# Patient Record
Sex: Male | Born: 1975 | Race: White | Hispanic: No | Marital: Married | Smoking: Never smoker
Health system: Southern US, Community
[De-identification: ages and names within clinical notes are randomized; demographics above are authoritative.]

## PROBLEM LIST (undated history)

## (undated) ENCOUNTER — Emergency Department (HOSPITAL_COMMUNITY)
Admission: EM | Disposition: A | Discharge status: Home / Self Care | Payer: 59 | Source: Home / Self Care | Attending: Emergency Medicine | Admitting: Emergency Medicine

## (undated) DIAGNOSIS — K219 Gastro-esophageal reflux disease without esophagitis: Secondary | ICD-10-CM

## (undated) DIAGNOSIS — I10 Essential (primary) hypertension: Secondary | ICD-10-CM

## (undated) HISTORY — PX: HERNIA REPAIR: SHX51

---

## 2017-06-14 NOTE — Progress Notes (Addendum)
[facility] [address], [address] [place], [place] [address] [phone] [phone]  Date:  06/15/2017   Name:  Brett Wheeler   DOB:  07/29/1976   MRN:  [id number]  PCP:  Pearline Cablesopland, Noele Icenhour C, MD    Chief Complaint: Establish Care (Pt here to est care. And will need forms filled out for both himself and his wife for foster care paperwork. Flu vaccine declined. )   History of Present Illness:  Brett Mottonson W Haugan is a 41 y.o. very pleasant male patient who presents with the following:  Here today as a new patient- his old doctor moved away, reviewed notes in Epic for him today He is Designer, industrial/productwarehouse manager, has been married for 6 years. They have 5 children at home- he needs his annual foster care form  He is married to Dumashastity They have 2 bio kids and 3 foster.  They have 2 43 yo, 1 12, and then 3 and 2.  History of essential hypertension-  He has been on medication in the past - losartan and HCTZ, but is no longer taking these   He does not really check his BP at home. He does have a cuff He thinks there is a family history of HTN He had a hernia repair as a pre-schooler and some thumb surgery as well  Flu: declines Labs: will do today He had a tetanus vacine 7 years ago   Patient Active Problem List   Diagnosis Date Noted  . Benign essential hypertension 06/15/2017    History reviewed. No pertinent past medical history.  Past Surgical History:  Procedure Laterality Date  . HERNIA REPAIR      Social History  Substance Use Topics  . Smoking status: Not on file  . Smokeless tobacco: Not on file  . Alcohol use Not on file    Family History  Problem Relation Age of Onset  . Cancer Maternal Grandfather   . Lymphoma Paternal Grandfather     Allergies not on file  Medication list has been reviewed and updated.  No current outpatient prescriptions on file prior to visit.   No current facility-administered medications on file prior  to visit.     Review of Systems:  As per HPI- otherwise negative.   Physical Examination: Vitals:   06/15/17 1715  BP: (!) 150/100  Pulse: 99  Temp: 98.1 F (36.7 C)  SpO2: 98%   Vitals:   06/15/17 1715  Weight: 207 lb 12.8 oz (94.3 kg)  Height: 6\' 5"  (1.956 m)   Body mass index is 24.64 kg/m. Ideal Body Weight: Weight in (lb) to have BMI = 25: 210.4  GEN: WDWN, NAD, Non-toxic, A & O x 3, tall build, looks well HEENT: Atraumatic, Normocephalic. Neck supple. No masses, No LAD.  Bilateral TM wnl, oropharynx normal.  PEERL,EOMI.   Ears and Nose: No external deformity. CV: RRR, No M/G/R. No JVD. No thrill. No extra heart sounds. PULM: CTA B, no wheezes, crackles, rhonchi. No retractions. No resp. distress. No accessory muscle use. ABD: S, NT, ND EXTR: No c/c/e NEURO Normal gait.  PSYCH: Normally interactive. Conversant. Not depressed or anxious appearing.  Calm demeanor.    Assessment and Plan: Benign essential hypertension - Plan: losartan (COZAAR) 50 MG tablet  Screening for hyperlipidemia - Plan: Lipid panel  Screening for deficiency anemia - Plan: CBC  Screening for diabetes mellitus - Plan: Hemoglobin A1c, Comprehensive metabolic panel  Completed foster care  form for him today  He does not have any risk factors for TB screening labs as above Will start back on losartan for HTN- he will monitor his BP at home and let me know how it looks in 2-3 weeks   Signed Abbe Amsterdam, MD  Received his labs, letter to pt 11/2 Results for orders placed or performed in visit on 06/15/17  CBC  Result Value Ref Range   WBC 7.4 4.0 - 10.5 K/uL   RBC 4.79 4.22 - 5.81 Mil/uL   Platelets 298.0 150.0 - 400.0 K/uL   Hemoglobin 14.5 13.0 - 17.0 g/dL   HCT 40.9 81.1 - 91.4 %   MCV 89.9 78.0 - 100.0 fl   MCHC 33.7 30.0 - 36.0 g/dL   RDW 78.2 95.6 - 21.3 %  Lipid panel  Result Value Ref Range   Cholesterol 168 0 - 200 mg/dL   Triglycerides 086.5 (H) 0.0 - 149.0 mg/dL    HDL 78.46 (L) >96.29 mg/dL   VLDL 52.8 (H) 0.0 - 41.3 mg/dL   Total CHOL/HDL Ratio 5    NonHDL 136.80   Hemoglobin A1c  Result Value Ref Range   Hgb A1c MFr Bld 5.3 4.6 - 6.5 %  Comprehensive metabolic panel  Result Value Ref Range   Sodium 140 135 - 145 mEq/L   Potassium 3.6 3.5 - 5.1 mEq/L   Chloride 104 96 - 112 mEq/L   CO2 30 19 - 32 mEq/L   Glucose, Bld 106 (H) 70 - 99 mg/dL   BUN 12 6 - 23 mg/dL   Creatinine, Ser 2.44 0.40 - 1.50 mg/dL   Total Bilirubin 0.5 0.2 - 1.2 mg/dL   Alkaline Phosphatase 43 39 - 117 U/L   AST 16 0 - 37 U/L   ALT 20 0 - 53 U/L   Total Protein 6.7 6.0 - 8.3 g/dL   Albumin 4.4 3.5 - 5.2 g/dL   Calcium 9.5 8.4 - 01.0 mg/dL   GFR 27.25 >36.64 mL/min  LDL cholesterol, direct  Result Value Ref Range   Direct LDL 125.0 mg/dL

## 2017-06-15 ENCOUNTER — Encounter: Payer: Self-pay | Admitting: Family Medicine

## 2017-06-15 ENCOUNTER — Ambulatory Visit (INDEPENDENT_AMBULATORY_CARE_PROVIDER_SITE_OTHER): Payer: 59 | Admitting: Family Medicine

## 2017-06-15 VITALS — BP 140/94 | HR 99 | Temp 98.1°F | Ht 77.0 in | Wt 207.8 lb

## 2017-06-15 DIAGNOSIS — Z130 Encounter for screening for diseases of the blood and blood-forming organs and certain disorders involving the immune mechanism: Secondary | ICD-10-CM | POA: Diagnosis not present

## 2017-06-15 DIAGNOSIS — Z131 Encounter for screening for diabetes mellitus: Secondary | ICD-10-CM

## 2017-06-15 DIAGNOSIS — Z13220 Encounter for screening for lipoid disorders: Secondary | ICD-10-CM

## 2017-06-15 DIAGNOSIS — I10 Essential (primary) hypertension: Secondary | ICD-10-CM | POA: Diagnosis not present

## 2017-06-15 MED ORDER — LOSARTAN POTASSIUM 50 MG PO TABS: 50.0000 mg | ORAL_TABLET | Freq: Every day | ORAL | 3 refills | Status: DC

## 2017-06-15 MED FILL — LOSARTAN POTASSIUM 50 MG TA: 50 | 90 days supply | Qty: 90 | Fill #0

## 2017-06-15 NOTE — Patient Instructions (Signed)
It was very nice to see you today!  I will be in touch with your labs asap Let's have you start back on losartan 50 mg daily for your blood pressure. Take this once a day- anytime of day is ok.  Please monitor your BP at home and let me know how it looks in 2-3 weeks

## 2017-06-16 LAB — CBC
HCT: 43 % (ref 39.0–52.0)
Hemoglobin: 14.5 g/dL (ref 13.0–17.0)
MCHC: 33.7 g/dL (ref 30.0–36.0)
MCV: 89.9 fl (ref 78.0–100.0)
PLATELETS: 298 10*3/uL (ref 150.0–400.0)
RBC: 4.79 Mil/uL (ref 4.22–5.81)
RDW: 13.1 % (ref 11.5–15.5)
WBC: 7.4 10*3/uL (ref 4.0–10.5)

## 2017-06-16 LAB — COMPREHENSIVE METABOLIC PANEL
ALT: 20 U/L (ref 0–53)
AST: 16 U/L (ref 0–37)
Albumin: 4.4 g/dL (ref 3.5–5.2)
Alkaline Phosphatase: 43 U/L (ref 39–117)
BUN: 12 mg/dL (ref 6–23)
CALCIUM: 9.5 mg/dL (ref 8.4–10.5)
CHLORIDE: 104 meq/L (ref 96–112)
CO2: 30 meq/L (ref 19–32)
Creatinine, Ser: 1.09 mg/dL (ref 0.40–1.50)
GFR: 78.51 mL/min (ref >60.00)
Glucose, Bld: 106 mg/dL — ABNORMAL HIGH (ref 70–99)
POTASSIUM: 3.6 meq/L (ref 3.5–5.1)
Sodium: 140 mEq/L (ref 135–145)
Total Bilirubin: 0.5 mg/dL (ref 0.2–1.2)
Total Protein: 6.7 g/dL (ref 6.0–8.3)

## 2017-06-16 LAB — LIPID PANEL
CHOLESTEROL: 168 mg/dL (ref 0–200)
HDL: 31.5 mg/dL — AB (ref >39.00)
NONHDL: 136.8
TRIGLYCERIDES: 201 mg/dL — AB (ref 0.0–149.0)
Total CHOL/HDL Ratio: 5
VLDL: 40.2 mg/dL — ABNORMAL HIGH (ref 0.0–40.0)

## 2017-06-16 LAB — HEMOGLOBIN A1C: Hgb A1c MFr Bld: 5.3 % (ref 4.6–6.5)

## 2017-06-16 LAB — LDL CHOLESTEROL, DIRECT: Direct LDL: 125 mg/dL

## 2017-08-16 ENCOUNTER — Ambulatory Visit: Payer: 59 | Admitting: Family Medicine

## 2017-08-16 ENCOUNTER — Encounter: Payer: Self-pay | Admitting: Family Medicine

## 2017-08-16 VITALS — BP 128/84 | HR 90 | Temp 98.4°F | Ht 77.0 in | Wt 210.2 lb

## 2017-08-16 DIAGNOSIS — J40 Bronchitis, not specified as acute or chronic: Secondary | ICD-10-CM | POA: Diagnosis not present

## 2017-08-16 MED ORDER — PREDNISONE 20 MG PO TABS: 40.0000 mg | ORAL_TABLET | Freq: Every day | ORAL | 0 refills | Status: AC

## 2017-08-16 MED ORDER — BENZONATATE 100 MG PO CAPS: 100.0000 mg | ORAL_CAPSULE | Freq: Three times a day (TID) | ORAL | 0 refills | Status: DC | PRN

## 2017-08-16 MED FILL — BENZONATATE 100 MG CAPSULE: 100 | 10 days supply | Qty: 30 | Fill #0

## 2017-08-16 MED FILL — predniSONE 20 MG TABS: 20 | 5 days supply | Qty: 10 | Fill #0

## 2017-08-16 NOTE — Progress Notes (Signed)
Chief Complaint  Patient presents with  . Cough    congestion    Brett Wheeler here for URI complaints.  Duration: 2 weeks  Associated symptoms: sinus congestion, rhinorrhea, chest tightness and cough Denies: sinus pain, itchy watery eyes, ear pain, ear drainage, sore throat, wheezing, shortness of breath and fevers/rigors Treatment to date: Nyquil, Theraflu, Tylenol cold and flu Sick contacts: Yes- son sick with PNA  ROS:  Const: Denies fevers HEENT: As noted in HPI Lungs: No SOB  History reviewed. No pertinent past medical history. Family History  Problem Relation Age of Onset  . Cancer Maternal Grandfather   . Lymphoma Paternal Grandfather     BP 128/84 (BP Location: Left Arm, Patient Position: Sitting, Cuff Size: Normal)   Pulse 90   Temp 98.4 F (36.9 C) (Oral)   Ht 6\' 5"  (1.956 m)   Wt 210 lb 4 oz (95.4 kg)   SpO2 96%   BMI 24.93 kg/m  General: Awake, alert, appears stated age HEENT: AT, Brett Wheeler, ears patent b/l and TM's neg, nares patent w/o discharge, pharynx pink and without exudates, MMM Neck: No masses or asymmetry Heart: RRR, no murmurs, no bruits Lungs: Diffuse wheezes, no accessory muscle use Psych: Age appropriate judgment and insight, normal mood and affect  Wheezy bronchitis - Plan: benzonatate (TESSALON) 100 MG capsule, predniSONE (DELTASONE) 20 MG tablet  Orders as above. Continue to push fluids, practice good hand hygiene, cover mouth when coughing. Call in 2 days if no better, will call in abx.  Pt voiced understanding and agreement to the plan.  Brett Rocheicholas Paul Clarks[place] Valley[place], DO 08/16/17 2:38 PM

## 2017-08-16 NOTE — Patient Instructions (Addendum)
Send me a MyChart message or call on Friday morning (2 days) if you are not doing better.  Continue to push fluids, practice good hand hygiene, and cover your mouth if you cough.  If you start having fevers, shaking or shortness of breath, seek immediate care.  Let us know if you need anything.

## 2017-08-16 NOTE — Progress Notes (Signed)
Pre visit review using our clinic review tool, if applicable. No additional management support is needed unless otherwise documented below in the visit note. 

## 2017-08-17 ENCOUNTER — Encounter: Payer: Self-pay | Admitting: Family Medicine

## 2017-08-17 ENCOUNTER — Other Ambulatory Visit: Payer: Self-pay | Admitting: Family Medicine

## 2017-08-17 MED ORDER — AZITHROMYCIN 250 MG PO TABS: ORAL_TABLET | ORAL | 0 refills | Status: DC

## 2017-08-17 MED FILL — AZITHROMYCIN 250 MG TABLET: 250 | 5 days supply | Qty: 6 | Fill #0

## 2017-08-24 ENCOUNTER — Emergency Department (HOSPITAL_COMMUNITY): Payer: 59

## 2017-08-24 ENCOUNTER — Emergency Department (HOSPITAL_COMMUNITY)
Admission: EM | Admit: 2017-08-24 | Discharge: 2017-08-24 | Disposition: A | Discharge status: Home / Self Care | Payer: 59 | Attending: Emergency Medicine | Admitting: Emergency Medicine

## 2017-08-24 ENCOUNTER — Encounter (HOSPITAL_COMMUNITY): Payer: Self-pay

## 2017-08-24 DIAGNOSIS — I10 Essential (primary) hypertension: Secondary | ICD-10-CM | POA: Diagnosis not present

## 2017-08-24 DIAGNOSIS — Z79899 Other long term (current) drug therapy: Secondary | ICD-10-CM | POA: Diagnosis not present

## 2017-08-24 DIAGNOSIS — M546 Pain in thoracic spine: Secondary | ICD-10-CM | POA: Insufficient documentation

## 2017-08-24 MED ORDER — METHOCARBAMOL 500 MG PO TABS: 500.0000 mg | ORAL_TABLET | Freq: Three times a day (TID) | ORAL | 0 refills | Status: AC | PRN

## 2017-08-24 MED ORDER — ACETAMINOPHEN 500 MG PO TABS
1000.0000 mg | ORAL_TABLET | Freq: Once | ORAL | Status: AC
  Administered 2017-08-24: 1000 mg via ORAL
  Filled 2017-08-24: qty 2

## 2017-08-24 NOTE — ED Notes (Signed)
Abrasion noted at forehead.

## 2017-08-24 NOTE — ED Notes (Signed)
ED Provider at bedside. 
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
## 2017-08-24 NOTE — ED Notes (Signed)
Patient transported to X-ray 

## 2017-08-24 NOTE — ED Notes (Signed)
Pt eating food provided by family.

## 2017-08-24 NOTE — ED Notes (Signed)
Family at bedside. 
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
## 2017-09-26 MED FILL — LOSARTAN POTASSIUM 50 MG TA: 50 | 90 days supply | Qty: 90 | Fill #1

## 2018-01-19 MED FILL — LOSARTAN POTASSIUM 50 MG TA: 50 | 90 days supply | Qty: 90 | Fill #2

## 2018-05-03 MED FILL — LOSARTAN POTASSIUM 50 MG TA: 50 | 30 days supply | Qty: 30 | Fill #3

## 2018-06-11 MED FILL — LOSARTAN POTASSIUM 50 MG TA: 50 | 30 days supply | Qty: 30 | Fill #4

## 2018-07-10 ENCOUNTER — Other Ambulatory Visit: Payer: Self-pay | Admitting: Family Medicine

## 2018-07-10 DIAGNOSIS — I10 Essential (primary) hypertension: Secondary | ICD-10-CM

## 2018-07-11 MED FILL — LOSARTAN POTASSIUM 50 MG TA: 50 | 90 days supply | Qty: 90 | Fill #0

## 2018-09-20 ENCOUNTER — Encounter: Payer: Self-pay | Admitting: Allergy & Immunology

## 2018-09-20 ENCOUNTER — Ambulatory Visit: Payer: Managed Care, Other (non HMO) | Admitting: Allergy & Immunology

## 2018-09-20 VITALS — BP 144/96 | HR 95 | Temp 98.1°F | Resp 16 | Ht 76.0 in | Wt 220.6 lb

## 2018-09-20 DIAGNOSIS — T7840XD Allergy, unspecified, subsequent encounter: Secondary | ICD-10-CM

## 2018-09-20 NOTE — Patient Instructions (Addendum)
1. Allergic reaction - unknown trigger - Testing today was positive only to chocolate and watermelon (but these were not large). - Try avoiding chocolate for a few months to see how things go. - We will get some labs to look for a red meat sensitivity as well as a ant venom allergy and stinging insect venom allergy. - We will call you in 1-2 weeks with the results of the testing. - EpiPen is up to date date. - Anaphylaxis management plan provided.   2. Return in about 3 months (around 12/19/2018).   Please inform us of any Emergency Department visits, hospitalizations, or changes in symptoms. Call us before going to the ED for breathing or allergy symptoms since we might be able to fit you in for a sick visit. Feel free to contact us anytime with any questions, problems, or concerns.  It was a pleasure to meet you and your family today!  Websites that have reliable patient information: 1. American Academy of Asthma, Allergy, and Immunology: www.aaaai.org 2. Food Allergy Research and Education (FARE): foodallergy.org 3. Mothers of Asthmatics: http://www.asthmacommunitynetwork.org 4. American College of Allergy, Asthma, and Immunology: MissingWeapons.ca   Make sure you are registered to vote! If you have moved or changed any of your contact information, you will need to get this updated before voting!    Voter ID laws are POSSIBLY going into effect for the General Election in November 2020! Be prepared! Check out LandscapingDigest.dk for more details.

## 2018-09-20 NOTE — Progress Notes (Signed)
NEW PATIENT  Date of Service/Encounter:  09/20/18  Referring provider: Pearline Cables, MD   Assessment:   Allergic reaction - with equivocal testing today to watermelon and chocolate  GERD  Hypertension  Plan/Recommendations:   1. Allergic reaction - unknown trigger - Testing today was positive only to chocolate and watermelon (but these were not large). - Try avoiding chocolate for a few months to see how things go. - We will get some labs to look for a red meat sensitivity as well as a ant venom allergy and stinging insect venom allergy. - We will call you in 1-2 weeks with the results of the testing. - EpiPen is up to date date. - Anaphylaxis management plan provided.   2. Return in about 3 months (around 12/19/2018).   Subjective:   Brett Wheeler is a 43 y.o. male presenting today for evaluation of  Chief Complaint  Patient presents with  . Angioedema    early Jan.  . Rash    Brett Wheeler has a history of the following: Patient Active Problem List   Diagnosis Date Noted  . Benign essential hypertension 06/15/2017    History obtained from: chart review and patient and his wife.  Brett Wheeler was referred by Copland, Gwenlyn Found, MD.     Brett Wheeler is a 43 y.o. male presenting for an evaluation of allergic reactions.  Over the last 2 months, he has had three reactions in total.  Early Jan 2020 - He had lip swelling. This happened around 5pm. He did break out in hives. These were over his entire body. This was typically on the back and the arms as well as the hip. He also had it in his crotch. He went to Urgent Care and was given an EpiPen. He also received a PO steroid. Symptoms cleared within 24-48 hours. Everything was gone the following day. He did not have breathing difficulties nbut the itching was the most intense. He denies stomach, passing out, or other problems. January was the worst by far.  December 2019 - This one happened in the morning when he got too  work. Patient took Benadryl at home to deal with them.   December 2019 - This happened in the evening as well.  He treated this at home with some antihistamines.   He denies eating anything in particular during the episodes. He does eat red meat around three times per week. The only possible linkage in December was the plastic lids at [facility]. He has drank out of bottles in the past without a problem.   He did start a new job, but the first reaction occurred before he changed his job. There is one dog which is not new to them.   They did have their daughter tested and she was positive to chocolate and caffeine.  Otherwise, there is no family history of this.  He denies any history of allergic rhinitis symptoms, including sneezing, itchy watery eyes, runny nose, postnasal drip, or nasal congestion.  He does not get antibiotics often at all, including.  He denies any history of asthma or eczema.   He does have a history of GERD and is on omeprazole.  He has been on omeprazole for 3-4 months, which replaced ranitidine since it was taken off the market. He also has high blood pressure and is on losartan.  He has been on losartan for several years at this point.  Otherwise, there is no history of other atopic diseases,  including asthma, drug allergies, stinging insect allergies, eczema or urticaria. There is no significant infectious history. Vaccinations are up to date.    Past Medical History: Patient Active Problem List   Diagnosis Date Noted  . Benign essential hypertension 06/15/2017    Medication List:  Allergies as of 09/20/2018   No Known Allergies     Medication List       Accurate as of September 20, 2018  3:43 PM. Always use your most recent med list.        EPINEPHrine 0.3 mg/0.3 mL Soaj injection Commonly known as:  EPI-PEN INJECT INTRAMUSCULARLY AS DIRECTED   losartan 50 MG tablet Commonly known as:  COZAAR TAKE 1 TABLET (50 MG TOTAL) BY MOUTH DAILY.   multivitamin  capsule Take by mouth.   omeprazole 20 MG capsule Commonly known as:  PRILOSEC Take 20 mg by mouth daily.       Birth History: non-contributory  Developmental History: non-contributory.   Past Surgical History: Past Surgical History:  Procedure Laterality Date  . HERNIA REPAIR       Family History: Family History  Problem Relation Age of Onset  . Cancer Maternal Grandfather   . Lymphoma Paternal Grandfather      Social History: Brett Wheeler lives at home with her wife.  He lives in a house that is 45 years old.  There is laminate in the main living areas and carpeting in the bedrooms.  They have electric heating and central cooling.  There is 1 dog in the home.  There are no dust mite covers on the bedding.  There is no tobacco exposure.  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    Review of Systems: a 14-point review of systems is pertinent for what is mentioned in HPI.  Otherwise, all other systems were negative. Constitutional: negative other than that listed in the HPI Eyes: negative other than that listed in the HPI Ears, nose, mouth, throat, and face: negative other than that listed in the HPI Respiratory: negative other than that listed in the HPI Cardiovascular: negative other than that listed in the HPI Gastrointestinal: negative other than that listed in the HPI Genitourinary: negative other than that listed in the HPI Integument: negative other than that listed in the HPI Hematologic: negative other than that listed in the HPI Musculoskeletal: negative other than that listed in the HPI Neurological: negative other than that listed in the HPI Allergy/Immunologic: negative other than that listed in the HPI    Objective:   Blood pressure (!) 144/96, pulse 95, temperature 98.1 F (36.7 C), temperature source Oral, resp. rate 16, height 6\' 4"  (1.93 m), weight 220 lb 9.6 oz (100.1 kg), SpO2 97 %. Body mass index is 26.85  kg/m.   Physical Exam:  General: Alert, interactive, in no acute distress. Pleasant. Talkative.  Eyes: No conjunctival injection bilaterally, no discharge on the right, no discharge on the left and no Horner-Trantas dots present. PERRL bilaterally. EOMI without pain. No photophobia.  Ears: Right TM pearly gray with normal light reflex, Left TM pearly gray with normal light reflex, Right TM intact without perforation and Left TM intact without perforation.  Nose/Throat: External nose within normal limits and septum midline. Turbinates minimally edematous without discharge. Posterior oropharynx mildly erythematous without cobblestoning in the posterior oropharynx. Tonsils 2+ without exudates.  Tongue without thrush. Neck: Supple without thyromegaly. Trachea midline. Adenopathy: no enlarged lymph nodes appreciated in the anterior cervical, occipital, axillary, epitrochlear, inguinal,  or popliteal regions. Lungs: Clear to auscultation without wheezing, rhonchi or rales. No increased work of breathing. CV: Normal S1/S2. No murmurs. Capillary refill <2 seconds.  Abdomen: Nondistended, nontender. No guarding or rebound tenderness. Bowel sounds present in all fields and hypoactive  Skin: Warm and dry, without lesions or rashes. Extremities:  No clubbing, cyanosis or edema. Neuro:   Grossly intact. No focal deficits appreciated. Responsive to questions.  Diagnostic studies:    Allergy Studies:   Food Adult Perc - 09/20/18 1400    Time Antigen Placed  1454    Allergen Manufacturer  Waynette Buttery    Location  Back    Number of allergen test  39     Control-buffer 50% Glycerol  Negative    Control-Histamine 1 mg/ml  2+    1. Peanut  Negative    3. Wheat  Negative    6. Egg White, Chicken  Negative    8. Shellfish Mix  Negative    10. Cashew  Negative    11. Pecan Food  Negative    12. Walnut Food  Negative    13. Almond  Negative    18. Catfish  Negative    21. Tuna  Negative    25. Shrimp   Negative    31. Oat   Negative    34. Rice  Negative    37. Pork  Negative    38. [place] Meat  Negative    39. Chicken Meat  Negative    40. Beef  Negative    42. Tomato  Negative    43. White Potato  Negative    44. Sweet Potato  Negative    47. Mushrooms  Negative    49. Onion  Negative    51. Carrots  Negative    53. Corn  Negative    54. Cucumber  Negative    56. Orange   Negative    57. Banana  Negative    58. Apple  Negative    59. Peach  Negative    60. Strawberry  Negative    61. Cantaloupe  Negative    62. Watermelon  Negative   +/-   63. Pineapple  Negative    64. Chocolate/Cacao bean  Negative   -/+   67. Cinnamon  Negative    70. Garlic  Negative    71. Pepper, black  Negative    72. Mustard  Negative        Allergy testing results were read and interpreted by myself, documented by clinical staff.       Malachi Bonds, MD [facility]

## 2018-09-25 LAB — ALPHA-GAL PANEL
ALPHA GAL IGE: 1.21 kU/L — AB (ref <0.10)
BEEF CLASS INTERPRETATION: 1
Beef (Bos spp) IgE: 0.48 kU/L — ABNORMAL HIGH (ref <0.35)
LAMB/MUTTON (OVIS SPP) IGE: 0.11 kU/L (ref <0.35)
PORK (SUS SPP) IGE: 0.24 kU/L (ref <0.35)

## 2018-09-25 LAB — TRYPTASE: TRYPTASE: 4.6 ug/L (ref 2.2–13.2)

## 2018-09-25 LAB — ALLERGEN FIRE ANT: I070-IgE Fire Ant (Invicta): 4.66 kU/L — AB

## 2018-09-25 LAB — ALLERGEN STINGING INSECT PANEL
Hornet, White Face, IgE: <0.1 kU/L
Hornet, Yellow, IgE: <0.1 kU/L
Paper Wasp IgE: 0.14 kU/L — AB
Yellow Jacket, IgE: 0.13 kU/L — AB

## 2018-12-27 ENCOUNTER — Ambulatory Visit: Payer: Managed Care, Other (non HMO) | Admitting: Allergy & Immunology

## 2018-12-27 ENCOUNTER — Other Ambulatory Visit: Payer: Self-pay | Admitting: Family Medicine

## 2018-12-27 DIAGNOSIS — I10 Essential (primary) hypertension: Secondary | ICD-10-CM

## 2019-01-21 MED FILL — LOSARTAN POTASSIUM 50 MG TA: 50 | 30 days supply | Qty: 30 | Fill #0

## 2019-01-28 MED FILL — LOSARTAN POTASSIUM 50 MG TA: 50 | 30 days supply | Qty: 30 | Fill #0

## 2019-04-26 ENCOUNTER — Encounter: Payer: Self-pay | Admitting: Family Medicine

## 2019-04-26 DIAGNOSIS — I10 Essential (primary) hypertension: Secondary | ICD-10-CM

## 2019-04-26 MED ORDER — LOSARTAN POTASSIUM 50 MG PO TABS: ORAL_TABLET | ORAL | 1 refills | Status: DC

## 2019-05-16 ENCOUNTER — Ambulatory Visit: Payer: 59 | Admitting: Family Medicine

## 2019-06-13 ENCOUNTER — Ambulatory Visit: Payer: 59 | Admitting: Family Medicine

## 2019-07-04 ENCOUNTER — Ambulatory Visit: Payer: 59 | Admitting: Family Medicine

## 2019-08-31 ENCOUNTER — Encounter: Payer: Self-pay | Admitting: Family Medicine

## 2019-09-05 ENCOUNTER — Encounter: Payer: Self-pay | Admitting: Family Medicine

## 2019-09-05 ENCOUNTER — Other Ambulatory Visit: Payer: Self-pay

## 2019-09-05 ENCOUNTER — Ambulatory Visit (INDEPENDENT_AMBULATORY_CARE_PROVIDER_SITE_OTHER): Payer: Managed Care, Other (non HMO) | Admitting: Family Medicine

## 2019-09-05 DIAGNOSIS — Z13220 Encounter for screening for lipoid disorders: Secondary | ICD-10-CM

## 2019-09-05 DIAGNOSIS — Z130 Encounter for screening for diseases of the blood and blood-forming organs and certain disorders involving the immune mechanism: Secondary | ICD-10-CM

## 2019-09-05 DIAGNOSIS — I10 Essential (primary) hypertension: Secondary | ICD-10-CM

## 2019-09-05 DIAGNOSIS — Z131 Encounter for screening for diabetes mellitus: Secondary | ICD-10-CM

## 2019-09-05 DIAGNOSIS — Z1211 Encounter for screening for malignant neoplasm of colon: Secondary | ICD-10-CM | POA: Diagnosis not present

## 2019-09-05 MED ORDER — LOSARTAN POTASSIUM 50 MG PO TABS: ORAL_TABLET | ORAL | 1 refills | Status: DC

## 2019-09-05 NOTE — Progress Notes (Signed)
[facility] [address], [address] [place], [place] [address] [phone]  Date:  09/05/2019   Name:  Brett Wheeler   DOB:  07/13/1976   MRN:  [id number]  PCP:  Pearline Cables, MD    Chief Complaint: No chief complaint on file.   History of Present Illness:  Brett Wheeler is a 44 y.o. very pleasant male patient who presents with the following:  Virtual visit today for follow-up-changed to a virtual visit due to recent exposure to COVID-19 Patient location is home, provider location is office.  Patient identity confirmed with 2 factors, he gives consent for virtual visit today The patient and myself are present on the call today  Patient with history of hypertension, treated with losartan Last seen by myself in November 2018  No major changes noted He is still taking losartan  He has not checked his BP in a while at home   He is married to my patient Chasity-they have 2 biological children oftentimes keep foster children as well; no foster kids at this time  Tetanus vaccine- he is due Flu vaccine -due for this  Overdue for labs  No family history of colon cancer  His wife would like him to do a colonoscopy which I will set up for him -referred to GI Patient Active Problem List   Diagnosis Date Noted  . Benign essential hypertension 06/15/2017    No past medical history on file.  Past Surgical History:  Procedure Laterality Date  . HERNIA REPAIR      Social History   Tobacco Use  . Smoking status: Never Smoker  . Smokeless tobacco: Never Used  Substance Use Topics  . Alcohol use: No  . Drug use: No    Family History  Problem Relation Age of Onset  . Cancer Maternal Grandfather   . Lymphoma Paternal Grandfather     No Known Allergies  Medication list has been reviewed and updated.  Current Outpatient Medications on File Prior to Visit  Medication Sig Dispense Refill  . EPINEPHrine 0.3 mg/0.3 mL  IJ SOAJ injection INJECT INTRAMUSCULARLY AS DIRECTED    . losartan (COZAAR) 50 MG tablet TAKE 1 TABLET (50 MG TOTAL) BY MOUTH DAILY **NEED OFFICE VISIT FOR FURTHER REFILLS** 30 tablet 1  . Multiple Vitamin (MULTIVITAMIN) capsule Take by mouth.    Marland Kitchen omeprazole (PRILOSEC) 20 MG capsule Take 20 mg by mouth daily.     No current facility-administered medications on file prior to visit.    Review of Systems:  As per HPI- otherwise negative.   Physical Examination: There were no vitals filed for this visit. There were no vitals filed for this visit. There is no height or weight on file to calculate BMI. Ideal Body Weight:    Patient is order video monitor, he looks well.  No cough, wheezing, distress is noted  Assessment and Plan: Benign essential hypertension - Plan: losartan (COZAAR) 50 MG tablet  Screening for colon cancer - Plan: Ambulatory referral to Gastroenterology  Screening for deficiency anemia  Screening for hyperlipidemia  Screening for diabetes mellitus  Following up today.  Refilled his blood pressure medication and referred to GI I made an appointment to come in in about 10 days for an in person visit, at that time we can update his labs and immunizations  Medical complexity today is low  Signed Abbe Amsterdam, MD

## 2019-09-18 ENCOUNTER — Other Ambulatory Visit: Payer: Self-pay

## 2019-09-18 NOTE — Patient Instructions (Addendum)
It was good to see you again today, I will be in touch with your labs as soon as possible BP looks ok- please check it at home on occasion,  If running higher than 140/90 on a consistent basis please let me know  I think you have plantar fascitis of your left foot- see hand out  Tetanus today    Health Maintenance, Male Adopting a healthy lifestyle and getting preventive care are important in promoting health and wellness. Ask your health care provider about:  The right schedule for you to have regular tests and exams.  Things you can do on your own to prevent diseases and keep yourself healthy. What should I know about diet, weight, and exercise? Eat a healthy diet   Eat a diet that includes plenty of vegetables, fruits, low-fat dairy products, and lean protein.  Do not eat a lot of foods that are high in solid fats, added sugars, or sodium. Maintain a healthy weight Body mass index (BMI) is a measurement that can be used to identify possible weight problems. It estimates body fat based on height and weight. Your health care provider can help determine your BMI and help you achieve or maintain a healthy weight. Get regular exercise Get regular exercise. This is one of the most important things you can do for your health. Most adults should:  Exercise for at least 150 minutes each week. The exercise should increase your heart rate and make you sweat (moderate-intensity exercise).  Do strengthening exercises at least twice a week. This is in addition to the moderate-intensity exercise.  Spend less time sitting. Even light physical activity can be beneficial. Watch cholesterol and blood lipids Have your blood tested for lipids and cholesterol at 46 years of age, then have this test every 5 years. You may need to have your cholesterol levels checked more often if:  Your lipid or cholesterol levels are high.  You are older than 46 years of age.  You are at high risk for heart  disease. What should I know about cancer screening? Many types of cancers can be detected early and may often be prevented. Depending on your health history and family history, you may need to have cancer screening at various ages. This may include screening for:  Colorectal cancer.  Prostate cancer.  Skin cancer.  Lung cancer. What should I know about heart disease, diabetes, and high blood pressure? Blood pressure and heart disease  High blood pressure causes heart disease and increases the risk of stroke. This is more likely to develop in people who have high blood pressure readings, are of African descent, or are overweight.  Talk with your health care provider about your target blood pressure readings.  Have your blood pressure checked: ? Every 3-5 years if you are 49-45 years of age. ? Every year if you are 78 years old or older.  If you are between the ages of 61 and 11 and are a current or former smoker, ask your health care provider if you should have a one-time screening for abdominal aortic aneurysm (AAA). Diabetes Have regular diabetes screenings. This checks your fasting blood sugar level. Have the screening done:  Once every three years after age 68 if you are at a normal weight and have a low risk for diabetes.  More often and at a younger age if you are overweight or have a high risk for diabetes. What should I know about preventing infection? Hepatitis B If you have a  higher risk for hepatitis B, you should be screened for this virus. Talk with your health care provider to find out if you are at risk for hepatitis B infection. Hepatitis C Blood testing is recommended for:  Everyone born from [birth year] through 1967.  Anyone with known risk factors for hepatitis C. Sexually transmitted infections (STIs)  You should be screened each year for STIs, including gonorrhea and chlamydia, if: ? You are sexually active and are younger than 46 years of age. ? You are older  than 46 years of age and your health care provider tells you that you are at risk for this type of infection. ? Your sexual activity has changed since you were last screened, and you are at increased risk for chlamydia or gonorrhea. Ask your health care provider if you are at risk.  Ask your health care provider about whether you are at high risk for HIV. Your health care provider may recommend a prescription medicine to help prevent HIV infection. If you choose to take medicine to prevent HIV, you should first get tested for HIV. You should then be tested every 3 months for as long as you are taking the medicine. Follow these instructions at home: Lifestyle  Do not use any products that contain nicotine or tobacco, such as cigarettes, e-cigarettes, and chewing tobacco. If you need help quitting, ask your health care provider.  Do not use street drugs.  Do not share needles.  Ask your health care provider for help if you need support or information about quitting drugs. Alcohol use  Do not drink alcohol if your health care provider tells you not to drink.  If you drink alcohol: ? Limit how much you have to 0-2 drinks a day. ? Be aware of how much alcohol is in your drink. In the [place] one drink equals one 12 oz bottle of beer (355 mL), one 5 oz glass of wine (148 mL), or one 1 oz glass of hard liquor (44 mL). General instructions  Schedule regular health, dental, and eye exams.  Stay current with your vaccines.  Tell your health care provider if: ? You often feel depressed. ? You have ever been abused or do not feel safe at home. Summary  Adopting a healthy lifestyle and getting preventive care are important in promoting health and wellness.  Follow your health care provider's instructions about healthy diet, exercising, and getting tested or screened for diseases.  Follow your health care provider's instructions on monitoring your cholesterol and blood pressure. This  information is not intended to replace advice given to you by your health care provider. Make sure you discuss any questions you have with your health care provider. Document Revised: 07/25/2018 Document Reviewed: 07/25/2018 Elsevier Patient Education  2020 [employer].

## 2019-09-18 NOTE — Progress Notes (Addendum)
[facility] [address] [place], [place] [address] [phone]  Date:  09/19/2019   Name:  Brett Wheeler   DOB:  04-21-1976   MRN:  [id number]  PCP:  Darreld Mclean, MD    Chief Complaint: Annual Exam   History of Present Illness:  Brett Wheeler is a 44 y.o. very pleasant male patient who presents with the following:  Here today for an in-person CPE History of HTN Most recent visit with me last month- virtual   Labs:2018, now due Flu declines Tetanus- would like to update today Colon cancer screening- referral is pending, he is checking on his insurance coverage   He does have a BP cuff at home that he can use - not using this much however  He has noted some heel pain c/w PF for 2-3 months  This is worse barefoot or early in the am Not getting worse NKI Married with 2 young children, his wife suffers from depression and anxiety   Patient Active Problem List   Diagnosis Date Noted  . Benign essential hypertension 06/15/2017    History reviewed. No pertinent past medical history.  Past Surgical History:  Procedure Laterality Date  . HERNIA REPAIR      Social History   Tobacco Use  . Smoking status: Never Smoker  . Smokeless tobacco: Never Used  Substance Use Topics  . Alcohol use: No  . Drug use: No    Family History  Problem Relation Age of Onset  . Cancer Maternal Grandfather   . Lymphoma Paternal Grandfather     No Known Allergies  Medication list has been reviewed and updated.  Current Outpatient Medications on File Prior to Visit  Medication Sig Dispense Refill  . EPINEPHrine 0.3 mg/0.3 mL IJ SOAJ injection INJECT INTRAMUSCULARLY AS DIRECTED    . losartan (COZAAR) 50 MG tablet TAKE 1 TABLET (50 MG TOTAL) BY MOUTH DAILY 90 tablet 1  . Multiple Vitamin (MULTIVITAMIN) capsule Take by mouth.    Marland Kitchen omeprazole (PRILOSEC) 20 MG capsule Take 20 mg by mouth daily.     No current  facility-administered medications on file prior to visit.    Review of Systems:  As per HPI- otherwise negative.   Physical Examination: Vitals:   09/19/19 1538 09/19/19 1556  BP: (!) 154/98 140/90  Pulse: 91   Resp: 16   Temp: (!) 97.1 F (36.2 C)   SpO2: 97%    Vitals:   09/19/19 1538  Weight: 216 lb (98 kg)  Height: 6\' 4"  (1.93 m)   Body mass index is 26.29 kg/m. Ideal Body Weight: Weight in (lb) to have BMI = 25: 205  GEN: WDWN, NAD, Non-toxic, A & O x 3, tall build, looks well  HEENT: Atraumatic, Normocephalic. Neck supple. No masses, No LAD. Ears and Nose: No external deformity. CV: RRR, No M/G/R. No JVD. No thrill. No extra heart sounds. PULM: CTA B, no wheezes, crackles, rhonchi. No retractions. No resp. distress. No accessory muscle use. ABD: S, NT, ND, +BS. No rebound. No HSM. EXTR: No c/c/e NEURO Normal gait.  PSYCH: Normally interactive. Conversant. Not depressed or anxious appearing.  Calm demeanor.  tenderness at the PF attachment at the right heel c/w plantar fascitiis Foot and ankle OW normal    Assessment and Plan: Physical exam  Screening for deficiency anemia - Plan: CBC  Screening for diabetes mellitus - Plan: Comprehensive metabolic panel, Hemoglobin A1c  Screening for cholesterol level - Plan: Lipid panel  Essential hypertension - Plan: CBC, Comprehensive metabolic panel  Screening for colon cancer  Immunization due - Plan: Tdap vaccine greater than or equal to 7yo IM  Plantar fasciitis of right foot  Here today for a CPE BP under ok control Update tdap Labs pending Continue to monitor home BP Will plan further follow- up pending labs.  This visit occurred during the SARS-CoV-2 public health emergency.  Safety protocols were in place, including screening questions prior to the visit, additional usage of staff PPE, and extensive cleaning of exam room while observing appropriate contact time as indicated for disinfecting solutions.     Signed Abbe Amsterdam, MD  addnd 2/5- received his labs  Results for orders placed or performed in visit on 09/19/19  CBC  Result Value Ref Range   WBC 8.0 4.0 - 10.5 K/uL   RBC 4.86 4.22 - 5.81 Mil/uL   Platelets 307.0 150.0 - 400.0 K/uL   Hemoglobin 14.6 13.0 - 17.0 g/dL   HCT 66.5 99.3 - 57.0 %   MCV 88.9 78.0 - 100.0 fl   MCHC 33.8 30.0 - 36.0 g/dL   RDW 17.7 93.9 - 03.0 %  Comprehensive metabolic panel  Result Value Ref Range   Sodium 139 135 - 145 mEq/L   Potassium 4.0 3.5 - 5.1 mEq/L   Chloride 102 96 - 112 mEq/L   CO2 30 19 - 32 mEq/L   Glucose, Bld 86 70 - 99 mg/dL   BUN 15 6 - 23 mg/dL   Creatinine, Ser 0.92 0.40 - 1.50 mg/dL   Total Bilirubin 0.4 0.2 - 1.2 mg/dL   Alkaline Phosphatase 65 39 - 117 U/L   AST 18 0 - 37 U/L   ALT 32 0 - 53 U/L   Total Protein 6.7 6.0 - 8.3 g/dL   Albumin 4.4 3.5 - 5.2 g/dL   GFR 33.00 >76.22 mL/min   Calcium 9.7 8.4 - 10.5 mg/dL  Hemoglobin Q3F  Result Value Ref Range   Hgb A1c MFr Bld 5.5 4.6 - 6.5 %  Lipid panel  Result Value Ref Range   Cholesterol 167 0 - 200 mg/dL   Triglycerides 354.5 (H) 0.0 - 149.0 mg/dL   HDL 62.56 (L) >38.93 mg/dL   VLDL 73.4 (H) 0.0 - 28.7 mg/dL   Total CHOL/HDL Ratio 6    NonHDL 136.87   LDL cholesterol, direct  Result Value Ref Range   Direct LDL 119.0 mg/dL   Message to pt

## 2019-09-19 ENCOUNTER — Encounter: Payer: Self-pay | Admitting: Family Medicine

## 2019-09-19 ENCOUNTER — Ambulatory Visit (INDEPENDENT_AMBULATORY_CARE_PROVIDER_SITE_OTHER): Payer: Managed Care, Other (non HMO) | Admitting: Family Medicine

## 2019-09-19 VITALS — BP 140/90 | HR 91 | Temp 97.1°F | Resp 16 | Ht 76.0 in | Wt 216.0 lb

## 2019-09-19 DIAGNOSIS — Z130 Encounter for screening for diseases of the blood and blood-forming organs and certain disorders involving the immune mechanism: Secondary | ICD-10-CM

## 2019-09-19 DIAGNOSIS — I10 Essential (primary) hypertension: Secondary | ICD-10-CM

## 2019-09-19 DIAGNOSIS — Z13220 Encounter for screening for lipoid disorders: Secondary | ICD-10-CM

## 2019-09-19 DIAGNOSIS — Z23 Encounter for immunization: Secondary | ICD-10-CM

## 2019-09-19 DIAGNOSIS — M722 Plantar fascial fibromatosis: Secondary | ICD-10-CM

## 2019-09-19 DIAGNOSIS — Z131 Encounter for screening for diabetes mellitus: Secondary | ICD-10-CM

## 2019-09-19 DIAGNOSIS — Z1211 Encounter for screening for malignant neoplasm of colon: Secondary | ICD-10-CM

## 2019-09-19 DIAGNOSIS — Z0000 Encounter for general adult medical examination without abnormal findings: Secondary | ICD-10-CM | POA: Diagnosis not present

## 2019-09-20 ENCOUNTER — Encounter: Payer: Self-pay | Admitting: Family Medicine

## 2019-09-20 LAB — LIPID PANEL
Cholesterol: 167 mg/dL (ref 0–200)
HDL: 30 mg/dL — ABNORMAL LOW (ref >39.00)
NonHDL: 136.87
Total CHOL/HDL Ratio: 6
Triglycerides: 213 mg/dL — ABNORMAL HIGH (ref 0.0–149.0)
VLDL: 42.6 mg/dL — ABNORMAL HIGH (ref 0.0–40.0)

## 2019-09-20 LAB — COMPREHENSIVE METABOLIC PANEL
ALT: 32 U/L (ref 0–53)
AST: 18 U/L (ref 0–37)
Albumin: 4.4 g/dL (ref 3.5–5.2)
Alkaline Phosphatase: 65 U/L (ref 39–117)
BUN: 15 mg/dL (ref 6–23)
CO2: 30 mEq/L (ref 19–32)
Calcium: 9.7 mg/dL (ref 8.4–10.5)
Chloride: 102 mEq/L (ref 96–112)
Creatinine, Ser: 1.24 mg/dL (ref 0.40–1.50)
GFR: 63 mL/min (ref >60.00)
Glucose, Bld: 86 mg/dL (ref 70–99)
Potassium: 4 mEq/L (ref 3.5–5.1)
Sodium: 139 mEq/L (ref 135–145)
Total Bilirubin: 0.4 mg/dL (ref 0.2–1.2)
Total Protein: 6.7 g/dL (ref 6.0–8.3)

## 2019-09-20 LAB — CBC
HCT: 43.2 % (ref 39.0–52.0)
Hemoglobin: 14.6 g/dL (ref 13.0–17.0)
MCHC: 33.8 g/dL (ref 30.0–36.0)
MCV: 88.9 fl (ref 78.0–100.0)
Platelets: 307 10*3/uL (ref 150.0–400.0)
RBC: 4.86 Mil/uL (ref 4.22–5.81)
RDW: 13 % (ref 11.5–15.5)
WBC: 8 10*3/uL (ref 4.0–10.5)

## 2019-09-20 LAB — HEMOGLOBIN A1C: Hgb A1c MFr Bld: 5.5 % (ref 4.6–6.5)

## 2019-09-20 LAB — LDL CHOLESTEROL, DIRECT: Direct LDL: 119 mg/dL

## 2019-10-11 ENCOUNTER — Encounter: Payer: Self-pay | Admitting: Family Medicine

## 2019-11-08 ENCOUNTER — Encounter: Payer: Self-pay | Admitting: Family Medicine

## 2019-11-16 ENCOUNTER — Encounter (HOSPITAL_COMMUNITY): Payer: Self-pay | Admitting: Emergency Medicine

## 2019-11-16 ENCOUNTER — Emergency Department (HOSPITAL_COMMUNITY)
Admission: EM | Admit: 2019-11-16 | Discharge: 2019-11-16 | Disposition: A | Discharge status: Home / Self Care | Payer: Managed Care, Other (non HMO) | Attending: Emergency Medicine | Admitting: Emergency Medicine

## 2019-11-16 ENCOUNTER — Emergency Department (HOSPITAL_COMMUNITY): Payer: Managed Care, Other (non HMO)

## 2019-11-16 DIAGNOSIS — Y9289 Other specified places as the place of occurrence of the external cause: Secondary | ICD-10-CM | POA: Diagnosis not present

## 2019-11-16 DIAGNOSIS — S61316A Laceration without foreign body of right little finger with damage to nail, initial encounter: Secondary | ICD-10-CM | POA: Diagnosis not present

## 2019-11-16 DIAGNOSIS — Z79899 Other long term (current) drug therapy: Secondary | ICD-10-CM | POA: Diagnosis not present

## 2019-11-16 DIAGNOSIS — I10 Essential (primary) hypertension: Secondary | ICD-10-CM | POA: Insufficient documentation

## 2019-11-16 DIAGNOSIS — S61319A Laceration without foreign body of unspecified finger with damage to nail, initial encounter: Secondary | ICD-10-CM

## 2019-11-16 DIAGNOSIS — Y998 Other external cause status: Secondary | ICD-10-CM | POA: Insufficient documentation

## 2019-11-16 DIAGNOSIS — S62636B Displaced fracture of distal phalanx of right little finger, initial encounter for open fracture: Secondary | ICD-10-CM | POA: Insufficient documentation

## 2019-11-16 DIAGNOSIS — S60511A Abrasion of right hand, initial encounter: Secondary | ICD-10-CM

## 2019-11-16 DIAGNOSIS — S62639B Displaced fracture of distal phalanx of unspecified finger, initial encounter for open fracture: Secondary | ICD-10-CM

## 2019-11-16 DIAGNOSIS — S6991XA Unspecified injury of right wrist, hand and finger(s), initial encounter: Secondary | ICD-10-CM | POA: Diagnosis present

## 2019-11-16 DIAGNOSIS — Y9389 Activity, other specified: Secondary | ICD-10-CM | POA: Diagnosis not present

## 2019-11-16 DIAGNOSIS — W231XXA Caught, crushed, jammed, or pinched between stationary objects, initial encounter: Secondary | ICD-10-CM | POA: Insufficient documentation

## 2019-11-16 DIAGNOSIS — S61219A Laceration without foreign body of unspecified finger without damage to nail, initial encounter: Secondary | ICD-10-CM

## 2019-11-16 HISTORY — DX: Essential (primary) hypertension: I10

## 2019-11-16 MED ORDER — CEPHALEXIN 500 MG PO CAPS: 500.0000 mg | ORAL_CAPSULE | Freq: Four times a day (QID) | ORAL | 0 refills | Status: DC

## 2019-11-16 MED ORDER — CEPHALEXIN 250 MG PO CAPS
500.0000 mg | ORAL_CAPSULE | Freq: Once | ORAL | Status: AC
Start: 2019-11-16 — End: 2019-11-16
  Administered 2019-11-16: 500 mg via ORAL
  Filled 2019-11-16: qty 2

## 2019-11-16 MED ORDER — HYDROCODONE-ACETAMINOPHEN 5-325 MG PO TABS: ORAL_TABLET | ORAL | 0 refills | Status: DC

## 2019-11-16 MED ORDER — LIDOCAINE HCL (PF) 1 % IJ SOLN
10.0000 mL | Freq: Once | INTRAMUSCULAR | Status: AC
  Administered 2019-11-16: 19:00:00 10 mL
  Filled 2019-11-16: qty 10

## 2019-11-16 NOTE — ED Notes (Signed)
Called [facility] pharmacy to advise that rx for cephalexin and norco was sent in error and to cancel rx and not fill it as it was sent to another pharmacy per pt request.
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
## 2019-11-16 NOTE — Discharge Instructions (Signed)
Please read and follow all provided instructions.  Your diagnoses today include:  1. Open fracture of tuft of distal phalanx of finger   2. Complicated laceration of finger, initial encounter   3. Nailbed laceration, finger, initial encounter     Tests performed today include:  X-ray of the affected areas shows a broken little fingertip  Vital signs. See below for your results today.   Medications prescribed:   Vicodin (hydrocodone/acetaminophen) - narcotic pain medication  DO NOT drive or perform any activities that require you to be awake and alert because this medicine can make you drowsy. BE VERY CAREFUL not to take multiple medicines containing Tylenol (also called acetaminophen). Doing so can lead to an overdose which can damage your liver and cause liver failure and possibly death.   Keflex (cephalexin) - antibiotic  You have been prescribed an antibiotic medicine: take the entire course of medicine even if you are feeling better. Stopping early can cause the antibiotic not to work.  Take any prescribed medications only as directed.   Home care instructions:  Follow any educational materials and wound care instructions contained in this packet.   Keep affected area above the level of your heart when possible to minimize swelling. Wash area gently twice a day with warm soapy water. Do not apply alcohol or hydrogen peroxide. Cover the area if it draining or weeping.   Follow-up instructions: Call Dr. Hinda Glatter office on Monday to schedule an appointment for follow-up.  They should see you in the clinic next week.  Return instructions:  Return to the Emergency Department if you have:  Fever  Worsening pain  Worsening swelling of the wound  Pus draining from the wound  Redness of the skin that moves away from the wound, especially if it streaks away from the affected area   Any other emergent concerns  Your vital signs today were: BP 128/85 (BP Location: Right  Arm)   Pulse 99   Temp 98.2 F (36.8 C) (Oral)   Resp 20   Ht 6\' 4"  (1.93 m)   Wt 97.5 kg   SpO2 98%   BMI 26.17 kg/m  If your blood pressure (BP) was elevated above 135/85 this visit, please have this repeated by your doctor within one month. --------------

## 2019-11-16 NOTE — ED Provider Notes (Signed)
[facility] EMERGENCY DEPARTMENT Provider Note   CSN: [id number] Arrival date & time: 11/16/19  1646     History Chief Complaint  Patient presents with  . Hand injury    Brett Wheeler is a 44 y.o. male.  Patient presents to the emergency department with complaint of right hand injury occurring just prior to arrival.  Patient states that he got his fingers pinched between a brake caliper and the disc of a break he was working on.  Patient sustained abrasions to his index, middle, and ring fingers.  He sustained a more significant crush injury to the little finger tip.  The nail was then seated.  There is a deep laceration with venous bleeding.  Patient states that his last tetanus shot was less than a year ago.  He denies injuries to the hand or wrist.  Bandage placed prior to arrival.        Past Medical History:  Diagnosis Date  . Hypertension     Patient Active Problem List   Diagnosis Date Noted  . Benign essential hypertension 06/15/2017    Past Surgical History:  Procedure Laterality Date  . HERNIA REPAIR         Family History  Problem Relation Age of Onset  . Cancer Maternal Grandfather   . Lymphoma Paternal Grandfather     Social History   Tobacco Use  . Smoking status: Never Smoker  . Smokeless tobacco: Never Used  Substance Use Topics  . Alcohol use: No  . Drug use: No    Home Medications Prior to Admission medications   Medication Sig Start Date End Date Taking? Authorizing Provider  EPINEPHrine 0.3 mg/0.3 mL IJ SOAJ injection INJECT INTRAMUSCULARLY AS DIRECTED 08/16/18   [provider]  losartan (COZAAR) 50 MG tablet TAKE 1 TABLET (50 MG TOTAL) BY MOUTH DAILY 09/05/19   Copland, Gwenlyn Found, MD  Multiple Vitamin (MULTIVITAMIN) capsule Take by mouth.    [provider]  omeprazole (PRILOSEC) 20 MG capsule Take 20 mg by mouth daily.    [provider]    Allergies    Patient has no known  allergies.  Review of Systems   Review of Systems  Gastrointestinal: Negative for nausea and vomiting.  Musculoskeletal: Positive for arthralgias and myalgias. Negative for joint swelling and neck pain.  Skin: Positive for wound.  Neurological: Negative for weakness and numbness.    Physical Exam Updated Vital Signs BP 128/85 (BP Location: Right Arm)   Pulse 99   Temp 98.2 F (36.8 C) (Oral)   Resp 20   Ht 6\' 4"  (1.93 m)   Wt 97.5 kg   SpO2 98%   BMI 26.17 kg/m   Physical Exam Vitals and nursing note reviewed.  Constitutional:      Appearance: He is well-developed.  HENT:     Head: Normocephalic and atraumatic.  Eyes:     Conjunctiva/sclera: Conjunctivae normal.  Cardiovascular:     Pulses: Normal pulses. No decreased pulses.  Musculoskeletal:        General: Tenderness present.     Cervical back: Normal range of motion and neck supple.     Comments: Right hand: There are scattered abrasions, all superficial, of the index, middle, ring fingers.  Right ring finger.  There is a 1 cm deep laceration extending from the nail margin over the radial aspect of the digit.  The nail is avulsed and unseated, completely outside of the nail fold.  After nail removal,  there is a complex irregular laceration of the nailbed, 1 cm total length.  Crepitus felt with manipulation of the fingertip.  Skin:    General: Skin is warm and dry.  Neurological:     Mental Status: He is alert.     Sensory: No sensory deficit.     Comments: Normal capillary refill of each digit, less than 2 seconds.            Post-repair:         ED Results / Procedures / Treatments   Labs (all labs ordered are listed, but only abnormal results are displayed) Labs Reviewed - No data to display  EKG None  Radiology DG Hand Complete Right  Result Date: 11/16/2019 CLINICAL DATA:  Right small finger injury EXAM: RIGHT HAND - COMPLETE 3+ VIEW COMPARISON:  None. FINDINGS: Comminuted, displaced  fractures of the tuft of the right fifth distal phalanx. No radiopaque foreign body. No other fracture or dislocation of the right hand. Joint spaces are preserved. IMPRESSION: Comminuted, displaced fractures of the tuft of the right fifth distal phalanx. No radiopaque foreign body. Electronically Signed   By: Lauralyn Primes M.D.   On: 11/16/2019 17:43    Procedures .Marland KitchenLaceration Repair  Date/Time: 11/16/2019 10:12 PM Performed by: Renne Crigler, PA-C Authorized by: Renne Crigler, PA-C   Consent:    Consent obtained:  Verbal   Consent given by:  Patient   Risks discussed:  Infection, pain, vascular damage, poor wound healing, poor cosmetic result and need for additional repair   Alternatives discussed:  Referral Anesthesia (see MAR for exact dosages):    Anesthesia method:  Local infiltration and nerve block   Local anesthetic:  Lidocaine 1% w/o epi   Block location:  Right ring finger   Block needle gauge:  27 G   Block anesthetic:  Lidocaine 1% w/o epi   Block technique:  3 sided ring block with additional areas of local infiltration   Block injection procedure:  Anatomic landmarks identified, introduced needle, incremental injection, anatomic landmarks palpated and negative aspiration for blood   Block outcome:  Anesthesia achieved Laceration details:    Location:  Finger   Finger location:  R small finger   Length (cm):  2 Repair type:    Repair type:  Complex Pre-procedure details:    Preparation:  Patient was prepped and draped in usual sterile fashion and imaging obtained to evaluate for foreign bodies Exploration:    Hemostasis achieved with:  Tourniquet and direct pressure   Wound exploration: wound explored through full range of motion and entire depth of wound probed and visualized     Wound extent: underlying fracture     Wound extent: no foreign bodies/material noted and no vascular damage noted     Contaminated: no   Treatment:    Area cleansed with:  Saline    Amount of cleaning:  Extensive   Irrigation solution:  Sterile water and sterile saline   Irrigation volume:  2000cc total   Irrigation method:  Pressure wash   Visualized foreign bodies/material removed: no     Debridement:  Minimal   Undermining:  None Skin repair:    Repair method:  Sutures Approximation:    Approximation:  Close Post-procedure details:    Dressing:  Bulky dressing   Patient tolerance of procedure:  Tolerated well, no immediate complications Comments:     Fingertip laceration: Closed with #3 4-0 Ethilon sutures, simple interrupted technique. Nailbed laceration: Closed with number four 5-0  Chromic Gut sutures, simple interrupted technique. Nail retention: #2 simple interrupted 4-0 Ethilon sutures placed through nail and into the lateral portion of the finger to hold fingernail in place.   (including critical care time)  Medications Ordered in ED Medications  lidocaine (PF) (XYLOCAINE) 1 % injection 10 mL (10 mLs Infiltration Given 11/16/19 1921)  cephALEXin (KEFLEX) capsule 500 mg (500 mg Oral Given 11/16/19 2028)    ED Course  I have reviewed the triage vital signs and the nursing notes.  Pertinent labs & imaging results that were available during my care of the patient were reviewed by me and considered in my medical decision making (see chart for details).  Patient seen and examined. X-ray ordered.   Vital signs reviewed and are as follows: BP 128/85 (BP Location: Right Arm)   Pulse 99   Temp 98.2 F (36.8 C) (Oral)   Resp 20   Ht 6\' 4"  (1.93 m)   Wt 97.5 kg   SpO2 98%   BMI 26.17 kg/m   Patient with open tuft fracture with nailbed laceration and fingertip laceration with nail avulsion.  I discussed the case with Dr. Jeannie Fend who is in the operating room tonight.  We discussed repair of the patient's injury.  After discussion, I will proceed with anesthesia, wound repair.  Patient be started on antibiotics and will follow up with Dr. Jeannie Fend in his  office during the upcoming week.  Wound repaired as above.  Patient then placed in a finger splint.  Home with pain medication and Keflex.  Patient counseled on wound care. Patient counseled on need to follow-up with orthopedic hand surgery. Patient was urged to return to the Emergency Department urgently with worsening pain, swelling, expanding erythema especially if it streaks away from the affected area, fever, or if they have any other concerns. Patient verbalized understanding.   Patient counseled on use of narcotic pain medications. Counseled not to combine these medications with others containing tylenol. Urged not to drink alcohol, drive, or perform any other activities that requires focus while taking these medications. The patient verbalizes understanding and agrees with the plan.     MDM Rules/Calculators/A&P                      Patient with open tuft fracture of the distal phalanx of the right ring finger with fingertip laceration, nailbed laceration, nail avulsion.  Discussed with orthopedic hand surgery.  Repaired as above.  Tetanus is up-to-date.  Patient started on antibiotics.  Discharged home with pain control.  Follow-up as discussed above.    Final Clinical Impression(s) / ED Diagnoses Final diagnoses:  Open fracture of tuft of distal phalanx of finger  Complicated laceration of finger, initial encounter  Nailbed laceration, finger, initial encounter  Abrasion of right hand, initial encounter    Rx / DC Orders ED Discharge Orders         Ordered    cephALEXin (KEFLEX) 500 MG capsule  4 times daily,   Status:  Discontinued     11/16/19 2026    HYDROcodone-acetaminophen (NORCO/VICODIN) 5-325 MG tablet  Status:  Discontinued     11/16/19 2026    cephALEXin (KEFLEX) 500 MG capsule  4 times daily     11/16/19 2041    HYDROcodone-acetaminophen (NORCO/VICODIN) 5-325 MG tablet     11/16/19 2041           Carlisle Cater, PA-C 11/16/19 2217    Virgel Manifold,  MD 11/17/19 2118

## 2019-11-17 ENCOUNTER — Telehealth: Payer: Self-pay | Admitting: Surgery

## 2019-11-17 NOTE — Telephone Encounter (Signed)
Patient called in because prescription were sent to pharmacy which was closed today, and patient wanted to start antibiotic, request Keflex be sent to [facility] in [place], Prescription called in patient updated.  No further ED CM needs identified.

## 2020-07-15 ENCOUNTER — Encounter: Payer: Self-pay | Admitting: Family Medicine

## 2020-08-04 ENCOUNTER — Other Ambulatory Visit: Payer: Self-pay | Admitting: Family Medicine

## 2020-08-04 DIAGNOSIS — I10 Essential (primary) hypertension: Secondary | ICD-10-CM

## 2020-11-13 NOTE — Progress Notes (Addendum)
[facility] [address] [place], [place] [address] [phone]  Date:  11/16/2020   Name:  Brett Wheeler   DOB:  1975/09/28   MRN:  [id number]  PCP:  Pearline Cables, MD    Chief Complaint: Annual Exam and Hand Pain (Right hand pain, no known injury/)   History of Present Illness:  Brett Wheeler is a 45 y.o. very pleasant male patient who presents with the following:  Pt here today for a follow-up visit Last seen by myself about 14 months ago for a CPE History of HTN Married with 3 children- they are all teens/ 20s  Colon cancer screening- his father has had benign polyps.  We talked about options for screening- he would like to do colonoscopy Hep C screening- not done  covid vaccine- not done Labs one year ago   He is taking his losartan at least 3x a week but not daily He does have a BP cuff but does not often check his BP at home  He has felt fine, no CP or SOB   He has noted some pain at the past of his right thumb- NKI  His left testicle has bothered him for a month or so- not tender but it has seemed swollen  No redness  Patient Active Problem List   Diagnosis Date Noted  . Benign essential hypertension 06/15/2017    Past Medical History:  Diagnosis Date  . Hypertension     Past Surgical History:  Procedure Laterality Date  . HERNIA REPAIR      Social History   Tobacco Use  . Smoking status: Never Smoker  . Smokeless tobacco: Never Used  Vaping Use  . Vaping Use: Never used  Substance Use Topics  . Alcohol use: No  . Drug use: No    Family History  Problem Relation Age of Onset  . Cancer Maternal Grandfather   . Lymphoma Paternal Grandfather     No Known Allergies  Medication list has been reviewed and updated.  Current Outpatient Medications on File Prior to Visit  Medication Sig Dispense Refill  . EPINEPHrine 0.3 mg/0.3 mL IJ SOAJ injection INJECT INTRAMUSCULARLY AS  DIRECTED    . losartan (COZAAR) 50 MG tablet Take 1 tablet by mouth once daily 90 tablet 0  . omeprazole (PRILOSEC) 20 MG capsule Take 20 mg by mouth daily.     No current facility-administered medications on file prior to visit.    Review of Systems:  As per HPI- otherwise negative.   Physical Examination: Vitals:   11/16/20 1349 11/16/20 1437  BP: (!) 188/120 (!) 160/105  Pulse: 99   Resp: 16   Temp: 98.6 F (37 C)   SpO2: 99%    Vitals:   11/16/20 1349  Weight: 225 lb (102.1 kg)  Height: 6\' 4"  (1.93 m)   Body mass index is 27.39 kg/m. Ideal Body Weight: Weight in (lb) to have BMI = 25: 205  GEN: no acute distress. Overweight, otherwise looks well  HEENT: Atraumatic, Normocephalic.   Bilateral TM wnl, oropharynx normal.  PEERL,EOMI.   Ears and Nose: No external deformity. CV: RRR, No M/G/R. No JVD. No thrill. No extra heart sounds. PULM: CTA B, no wheezes, crackles, rhonchi. No retractions. No resp. distress. No accessory muscle use. ABD: S, NT, ND, +BS. No rebound. No HSM. EXTR: No c/c/e PSYCH: Normally interactive. Conversant.  Pt examined with his wife present  as chaperone.  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  He will try to be more consistent with this and report back to  me about his BP readings. If still elevated can increase losartan dose GI referral for colonoscopy  Referral for scrotal US Will plan further follow- up pending labs.  This visit occurred during the SARS-CoV-2 public health emergency.  Safety protocols were in place, including screening questions prior to the visit, additional usage of staff PPE, and extensive cleaning of exam room while observing appropriate contact time as indicated for disinfecting solutions.    Signed Abbe Amsterdam, MD  Received labs 4/5- message to pt  Results for orders placed or performed in visit on 11/16/20  CBC  Result Value Ref Range   WBC 7.2 4.0 - 10.5 K/uL   RBC 5.09 4.22 - 5.81 Mil/uL   Platelets 292.0 150.0 - 400.0 K/uL   Hemoglobin 15.3 13.0 - 17.0 g/dL   HCT 92.4 26.8 - 34.1 %   MCV 87.2 78.0 - 100.0 fl   MCHC 34.4 30.0 - 36.0 g/dL   RDW 96.2 22.9 - 79.8 %  Comprehensive metabolic panel  Result Value Ref Range   Sodium 139 135 - 145 mEq/L   Potassium 4.2 3.5 - 5.1 mEq/L   Chloride 103 96 - 112 mEq/L   CO2 29 19 - 32 mEq/L   Glucose, Bld 80 70 - 99 mg/dL   BUN 12 6 - 23 mg/dL   Creatinine, Ser 9.21 0.40 - 1.50 mg/dL   Total Bilirubin 0.5 0.2 - 1.2 mg/dL   Alkaline Phosphatase 55 39 - 117 U/L   AST 21 0 - 37 U/L   ALT 40 0 - 53 U/L   Total Protein 6.7 6.0 - 8.3 g/dL   Albumin 4.4 3.5 - 5.2 g/dL   GFR 19.41 >74.08 mL/min   Calcium 9.9 8.4 - 10.5 mg/dL  Hemoglobin X4G  Result Value Ref Range   Hgb A1c MFr Bld 5.6 4.6 - 6.5 %  Lipid panel  Result Value Ref Range   Cholesterol 192 0 - 200 mg/dL   Triglycerides 818.5 (H) 0.0 - 149.0 mg/dL   HDL 63.14 (L) >97.02 mg/dL   VLDL 63.7 0.0 - 85.8 mg/dL   LDL Cholesterol 850 (H) 0 - 99 mg/dL   Total CHOL/HDL Ratio 6    NonHDL 159.60

## 2020-11-16 ENCOUNTER — Encounter: Payer: Self-pay | Admitting: Family Medicine

## 2020-11-16 ENCOUNTER — Ambulatory Visit (INDEPENDENT_AMBULATORY_CARE_PROVIDER_SITE_OTHER): Payer: 59 | Admitting: Family Medicine

## 2020-11-16 ENCOUNTER — Other Ambulatory Visit: Payer: Self-pay

## 2020-11-16 VITALS — BP 160/105 | HR 99 | Temp 98.6°F | Resp 16 | Ht 76.0 in | Wt 225.0 lb

## 2020-11-16 DIAGNOSIS — I10 Essential (primary) hypertension: Secondary | ICD-10-CM

## 2020-11-16 DIAGNOSIS — Z1159 Encounter for screening for other viral diseases: Secondary | ICD-10-CM

## 2020-11-16 DIAGNOSIS — Z131 Encounter for screening for diabetes mellitus: Secondary | ICD-10-CM | POA: Diagnosis not present

## 2020-11-16 DIAGNOSIS — N5089 Other specified disorders of the male genital organs: Secondary | ICD-10-CM

## 2020-11-16 DIAGNOSIS — Z1211 Encounter for screening for malignant neoplasm of colon: Secondary | ICD-10-CM

## 2020-11-16 DIAGNOSIS — Z13220 Encounter for screening for lipoid disorders: Secondary | ICD-10-CM

## 2020-11-16 DIAGNOSIS — Z0000 Encounter for general adult medical examination without abnormal findings: Secondary | ICD-10-CM | POA: Diagnosis not present

## 2020-11-16 NOTE — Patient Instructions (Signed)
Good to see you again today-  I will be in touch with your labs We will get you set up for a testicular ultrasound to see if we can determine why you have some left sided swelling You can call [facility] imaging desk at [phone] to schedule I will also set you up for a screening colonoscopy Your BP is high- please be sure to take your losartan daily and check your BP for about one week.  Let me know how things look- we can increase your dose of losartan if need be (goal BP less than 140/85)  Please do consider getting your covid series asap-  I do think this is a good idea!   Health Maintenance, Male Adopting a healthy lifestyle and getting preventive care are important in promoting health and wellness. Ask your health care provider about:  The right schedule for you to have regular tests and exams.  Things you can do on your own to prevent diseases and keep yourself healthy. What should I know about diet, weight, and exercise? Eat a healthy diet  Eat a diet that includes plenty of vegetables, fruits, low-fat dairy products, and lean protein.  Do not eat a lot of foods that are high in solid fats, added sugars, or sodium.   Maintain a healthy weight Body mass index (BMI) is a measurement that can be used to identify possible weight problems. It estimates body fat based on height and weight. Your health care provider can help determine your BMI and help you achieve or maintain a healthy weight. Get regular exercise Get regular exercise. This is one of the most important things you can do for your health. Most adults should:  Exercise for at least 150 minutes each week. The exercise should increase your heart rate and make you sweat (moderate-intensity exercise).  Do strengthening exercises at least twice a week. This is in addition to the moderate-intensity exercise.  Spend less time sitting. Even light physical activity can be beneficial. Watch cholesterol and blood lipids Have  your blood tested for lipids and cholesterol at 47 years of age, then have this test every 5 years. You may need to have your cholesterol levels checked more often if:  Your lipid or cholesterol levels are high.  You are older than 47 years of age.  You are at high risk for heart disease. What should I know about cancer screening? Many types of cancers can be detected early and may often be prevented. Depending on your health history and family history, you may need to have cancer screening at various ages. This may include screening for:  Colorectal cancer.  Prostate cancer.  Skin cancer.  Lung cancer. What should I know about heart disease, diabetes, and high blood pressure? Blood pressure and heart disease  High blood pressure causes heart disease and increases the risk of stroke. This is more likely to develop in people who have high blood pressure readings, are of African descent, or are overweight.  Talk with your health care provider about your target blood pressure readings.  Have your blood pressure checked: ? Every 3-5 years if you are 45-32 years of age. ? Every year if you are 77 years old or older.  If you are between the ages of 44 and 46 and are a current or former smoker, ask your health care provider if you should have a one-time screening for abdominal aortic aneurysm (AAA). Diabetes Have regular diabetes screenings. This checks your fasting blood sugar  level. Have the screening done:  Once every three years after age 91 if you are at a normal weight and have a low risk for diabetes.  More often and at a younger age if you are overweight or have a high risk for diabetes. What should I know about preventing infection? Hepatitis B If you have a higher risk for hepatitis B, you should be screened for this virus. Talk with your health care provider to find out if you are at risk for hepatitis B infection. Hepatitis C Blood testing is recommended for:  Everyone  born from [birth year] through 1967.  Anyone with known risk factors for hepatitis C. Sexually transmitted infections (STIs)  You should be screened each year for STIs, including gonorrhea and chlamydia, if: ? You are sexually active and are younger than 47 years of age. ? You are older than 47 years of age and your health care provider tells you that you are at risk for this type of infection. ? Your sexual activity has changed since you were last screened, and you are at increased risk for chlamydia or gonorrhea. Ask your health care provider if you are at risk.  Ask your health care provider about whether you are at high risk for HIV. Your health care provider may recommend a prescription medicine to help prevent HIV infection. If you choose to take medicine to prevent HIV, you should first get tested for HIV. You should then be tested every 3 months for as long as you are taking the medicine. Follow these instructions at home: Lifestyle  Do not use any products that contain nicotine or tobacco, such as cigarettes, e-cigarettes, and chewing tobacco. If you need help quitting, ask your health care provider.  Do not use street drugs.  Do not share needles.  Ask your health care provider for help if you need support or information about quitting drugs. Alcohol use  Do not drink alcohol if your health care provider tells you not to drink.  If you drink alcohol: ? Limit how much you have to 0-2 drinks a day. ? Be aware of how much alcohol is in your drink. In the [place] one drink equals one 12 oz bottle of beer (355 mL), one 5 oz glass of wine (148 mL), or one 1 oz glass of hard liquor (44 mL). General instructions  Schedule regular health, dental, and eye exams.  Stay current with your vaccines.  Tell your health care provider if: ? You often feel depressed. ? You have ever been abused or do not feel safe at home. Summary  Adopting a healthy lifestyle and getting preventive care are  important in promoting health and wellness.  Follow your health care provider's instructions about healthy diet, exercising, and getting tested or screened for diseases.  Follow your health care provider's instructions on monitoring your cholesterol and blood pressure. This information is not intended to replace advice given to you by your health care provider. Make sure you discuss any questions you have with your health care provider. Document Revised: 07/25/2018 Document Reviewed: 07/25/2018 Elsevier Patient Education  2021 [employer].

## 2020-11-17 ENCOUNTER — Encounter: Payer: Self-pay | Admitting: Family Medicine

## 2020-11-17 LAB — CBC
HCT: 44.4 % (ref 39.0–52.0)
Hemoglobin: 15.3 g/dL (ref 13.0–17.0)
MCHC: 34.4 g/dL (ref 30.0–36.0)
MCV: 87.2 fl (ref 78.0–100.0)
Platelets: 292 10*3/uL (ref 150.0–400.0)
RBC: 5.09 Mil/uL (ref 4.22–5.81)
RDW: 13 % (ref 11.5–15.5)
WBC: 7.2 10*3/uL (ref 4.0–10.5)

## 2020-11-17 LAB — COMPREHENSIVE METABOLIC PANEL
ALT: 40 U/L (ref 0–53)
AST: 21 U/L (ref 0–37)
Albumin: 4.4 g/dL (ref 3.5–5.2)
Alkaline Phosphatase: 55 U/L (ref 39–117)
BUN: 12 mg/dL (ref 6–23)
CO2: 29 mEq/L (ref 19–32)
Calcium: 9.9 mg/dL (ref 8.4–10.5)
Chloride: 103 mEq/L (ref 96–112)
Creatinine, Ser: 1.17 mg/dL (ref 0.40–1.50)
GFR: 74.86 mL/min (ref >60.00)
Glucose, Bld: 80 mg/dL (ref 70–99)
Potassium: 4.2 mEq/L (ref 3.5–5.1)
Sodium: 139 mEq/L (ref 135–145)
Total Bilirubin: 0.5 mg/dL (ref 0.2–1.2)
Total Protein: 6.7 g/dL (ref 6.0–8.3)

## 2020-11-17 LAB — LIPID PANEL
Cholesterol: 192 mg/dL (ref 0–200)
HDL: 32.6 mg/dL — ABNORMAL LOW (ref >39.00)
LDL Cholesterol: 122 mg/dL — ABNORMAL HIGH (ref 0–99)
NonHDL: 159.6
Total CHOL/HDL Ratio: 6
Triglycerides: 186 mg/dL — ABNORMAL HIGH (ref 0.0–149.0)
VLDL: 37.2 mg/dL (ref 0.0–40.0)

## 2020-11-17 LAB — HEMOGLOBIN A1C: Hgb A1c MFr Bld: 5.6 % (ref 4.6–6.5)

## 2020-11-17 LAB — HEPATITIS C ANTIBODY
Hepatitis C Ab: NONREACTIVE
SIGNAL TO CUT-OFF: 0.05 (ref <1.00)

## 2020-11-24 ENCOUNTER — Other Ambulatory Visit: Payer: Self-pay

## 2020-11-24 ENCOUNTER — Ambulatory Visit (HOSPITAL_BASED_OUTPATIENT_CLINIC_OR_DEPARTMENT_OTHER)
Admission: RE | Admit: 2020-11-24 | Discharge: 2020-11-24 | Disposition: A | Discharge status: Home / Self Care | Payer: 59 | Source: Ambulatory Visit | Attending: Family Medicine | Admitting: Family Medicine

## 2020-11-24 DIAGNOSIS — N5089 Other specified disorders of the male genital organs: Secondary | ICD-10-CM | POA: Insufficient documentation

## 2020-11-26 ENCOUNTER — Encounter: Payer: Self-pay | Admitting: Family Medicine

## 2020-12-31 ENCOUNTER — Other Ambulatory Visit: Payer: Self-pay | Admitting: Family Medicine

## 2020-12-31 DIAGNOSIS — I10 Essential (primary) hypertension: Secondary | ICD-10-CM

## 2021-01-29 ENCOUNTER — Encounter: Payer: Self-pay | Admitting: Physician Assistant

## 2021-01-29 ENCOUNTER — Telehealth: Payer: 59 | Admitting: Physician Assistant

## 2021-01-29 DIAGNOSIS — Z20822 Contact with and (suspected) exposure to covid-19: Secondary | ICD-10-CM | POA: Diagnosis not present

## 2021-01-29 MED ORDER — PROMETHAZINE-DM 6.25-15 MG/5ML PO SYRP: 5.0000 mL | ORAL_SOLUTION | Freq: Four times a day (QID) | ORAL | 0 refills | Status: DC | PRN

## 2021-01-29 NOTE — Progress Notes (Signed)
Virtual Visit Consent   Brett Wheeler, you are scheduled for a virtual visit with a [facility] provider today.     Just as with appointments in the office, your consent must be obtained to participate.  Your consent will be active for this visit and any virtual visit you may have with one of our providers in the next 365 days.     If you have a MyChart account, a copy of this consent can be sent to you electronically.  All virtual visits are billed to your insurance company just like a traditional visit in the office.    As this is a virtual visit, video technology does not allow for your provider to perform a traditional examination.  This may limit your provider's ability to fully assess your condition.  If your provider identifies any concerns that need to be evaluated in person or the need to arrange testing (such as labs, EKG, etc.), we will make arrangements to do so.     Although advances in technology are sophisticated, we cannot ensure that it will always work on either your end or our end.  If the connection with a video visit is poor, the visit may have to be switched to a telephone visit.  With either a video or telephone visit, we are not always able to ensure that we have a secure connection.     I need to obtain your verbal consent now.   Are you willing to proceed with your visit today?    Brett Wheeler has provided verbal consent on 01/29/2021 for a virtual visit (video or telephone).   Piedad Climes, [place]   Date: 01/29/2021 7:18 PM  Virtual Visit via Video Note   I, Piedad Climes, connected with Brett Wheeler ([id number], [birth year]) on 01/29/21 at  6:30 PM EDT by a video-enabled telemedicine application and verified that I am speaking with the correct person using two identifiers.  Location: Patient: Virtual Visit Location Patient: Home Provider: Virtual Visit Location Provider: Home Office   I discussed the limitations of evaluation and management by  telemedicine and the availability of in person appointments. The patient expressed understanding and agreed to proceed.    History of Present Illness: Brett Wheeler is a 44 y.o. who identifies as a male who was assigned male at birth, and is being seen today for abrupt onset of URI symptoms. Patient endorses that he first noted symptoms starting yesterday afternoon -- cough mainly dry but persistent, rhinorrhea and post-nasal drip. Now with fever, chills and body aches. Tmax axillary 100.2 axillary. Also noting significant headaches. Denies recent travel. Denies any chest pain or SOB. Denies loss of taste or smell.  No COVID vaccines or flu vaccines.     Problems:  Patient Active Problem List   Diagnosis Date Noted   Benign essential hypertension 06/15/2017    Allergies: No Known Allergies Medications:  Current Outpatient Medications:    promethazine-dextromethorphan (PROMETHAZINE-DM) 6.25-15 MG/5ML syrup, Take 5 mLs by mouth 4 (four) times daily as needed for cough., Disp: 118 mL, Rfl: 0   EPINEPHrine 0.3 mg/0.3 mL IJ SOAJ injection, INJECT INTRAMUSCULARLY AS DIRECTED, Disp: , Rfl:    losartan (COZAAR) 50 MG tablet, Take 1 tablet (50 mg total) by mouth daily., Disp: 90 tablet, Rfl: 1   omeprazole (PRILOSEC) 20 MG capsule, Take 20 mg by mouth daily., Disp: , Rfl:   Observations/Objective: Patient is well-developed, well-nourished in no acute distress.  Resting comfortably on bed  at home.  Head is normocephalic, atraumatic.  No labored breathing.  Speech is clear and coherent with logical content.  Patient is alert and oriented at baseline.   Assessment and Plan: 1. Suspected COVID-19 virus infection - promethazine-dextromethorphan (PROMETHAZINE-DM) 6.25-15 MG/5ML syrup; Take 5 mLs by mouth 4 (four) times daily as needed for cough.  Dispense: 118 mL; Refill: 0 - MyChart COVID-19 home monitoring program; Future Abrupt onset of significant URI symptoms, concerning for COVID 19. Patient  to be tested for COVID. He has home testing that he will complete first. Discussed if positive to let us know and we can make further treatment plans. If negative, I want him to go tomorrow for PCR testing. Resources given. Will enroll patient in MyChart symptom monitoring program. Will have him quarantine until we get all test results back. Supportive measures, OTC medications and vitamin regimen reviewed. Rx promethazine-DM for cough. Strict ER precautions discussed with patient and wife.   Follow Up Instructions: I discussed the assessment and treatment plan with the patient. The patient was provided an opportunity to ask questions and all were answered. The patient agreed with the plan and demonstrated an understanding of the instructions.  A copy of instructions were sent to the patient via MyChart.  The patient was advised to call back or seek an in-person evaluation if the symptoms worsen or if the condition fails to improve as anticipated.  Time:  I spent 17 minutes with the patient via telehealth technology discussing the above problems/concerns.    Piedad Climes, PA-C

## 2021-01-29 NOTE — Patient Instructions (Signed)
  Brett Wheeler, thank you for joining Piedad Climes, PA-C for today's virtual visit.  While this provider is not your primary care provider (PCP), if your PCP is located in our provider database this encounter information will be shared with them immediately following your visit.  Consent: (Patient) Brett Wheeler provided verbal consent for this virtual visit at the beginning of the encounter.  Current Medications:  Current Outpatient Medications:    promethazine-dextromethorphan (PROMETHAZINE-DM) 6.25-15 MG/5ML syrup, Take 5 mLs by mouth 4 (four) times daily as needed for cough., Disp: 118 mL, Rfl: 0   EPINEPHrine 0.3 mg/0.3 mL IJ SOAJ injection, INJECT INTRAMUSCULARLY AS DIRECTED, Disp: , Rfl:    losartan (COZAAR) 50 MG tablet, Take 1 tablet (50 mg total) by mouth daily., Disp: 90 tablet, Rfl: 1   omeprazole (PRILOSEC) 20 MG capsule, Take 20 mg by mouth daily., Disp: , Rfl:    Medications ordered in this encounter:  Meds ordered this encounter  Medications   promethazine-dextromethorphan (PROMETHAZINE-DM) 6.25-15 MG/5ML syrup    Sig: Take 5 mLs by mouth 4 (four) times daily as needed for cough.    Dispense:  118 mL    Refill:  0    Order Specific Question:   Supervising Provider    Answer:   Hyacinth Meeker, BRIAN [[id number]]     *If you need refills on other medications prior to your next appointment, please contact your pharmacy*  Follow-Up: Call back or seek an in-person evaluation if the symptoms worsen or if the condition fails to improve as anticipated.  Other Instructions Please keep well-hydrated and get plenty of rest.  Take plain Mucinex OTC for congestion Start OTC Vitamin D3 1000 units daily, Vitamin C 1000 mg daily USe the prescription cough medication as directed.  Please take a COVID test. If positive, please let us know. If negative, I want you to get a PCR COVID test at your local pharmacy or at one of our sites (can register at [email or web address]).  Quarantine  until results are in. Again, please let us know results so we can make further adjustments to treatment.  You have been enrolled in a symptom monitoring program. Please fill out these MyChart questionnaires daily so we can keep track of your symptoms. If you note any acute worsening of symptoms, any chest pain or any significant shortness of breath, please be evaluated at nearest emergency department ASAP.  Please do not delay car  If you have been instructed to have an in-person evaluation today at a local Urgent Care facility, please use the link below. It will take you to a list of all of our available [facility], including address, phone number and hours of operation. Please do not delay care.  [facility]  If you or a family member do not have a primary care provider, use the link below to schedule a visit and establish care. When you choose a [facility] primary care physician or advanced practice provider, you gain a long-term partner in health. Find a Primary Care Provider  Learn more about [facility]'s in-office and virtual care options:  - Get Care Now

## 2021-01-30 ENCOUNTER — Encounter: Payer: Self-pay | Admitting: Nurse Practitioner

## 2021-01-30 ENCOUNTER — Encounter: Payer: Self-pay | Admitting: Family Medicine

## 2021-01-30 ENCOUNTER — Other Ambulatory Visit: Payer: Self-pay | Admitting: Family Medicine

## 2021-01-30 ENCOUNTER — Telehealth: Payer: 59 | Admitting: Nurse Practitioner

## 2021-01-30 DIAGNOSIS — U071 COVID-19: Secondary | ICD-10-CM | POA: Diagnosis not present

## 2021-01-30 MED ORDER — NIRMATRELVIR/RITONAVIR (PAXLOVID)TABLET: 3.0000 | ORAL_TABLET | Freq: Two times a day (BID) | ORAL | 0 refills | Status: AC

## 2021-01-30 NOTE — Patient Instructions (Signed)
You are being prescribed PAXLOVID for COVID-19 infection.   Please pick your prescription up at [facility] ([address] [place], [place], [place] [address][phone]) Monday through Friday 7:30a-6p  [facility] [phone] [place], [place] [address] [phone]) Monday through Friday 7:30a-6p  [facility] ([address] [place], [place] [address][phone])  Monday through Friday 8a-5:30p  [facility] [address] [place], [place] [address] [phone]) Hours Monday through Friday 8a-7p, Saturday 8a-5p, Sunday 1p-5p    Please pick up your prescription at: [facility] pharmacy    Please call the pharmacy or go through the drive through vs going inside if you are picking up the mediation yourself to prevent further spread. If prescribed to a [facility] affiliated pharmacy, a pharmacist will bring the medication out to your car.   Medications to hold while taking this treatment: none  *If asked to hold, you can resume them 24 hours after your last dose   ADMINISTRATION INSTRUCTIONS: Take with or without food. Swallow the tablets whole. Don't chew, crush, or break the medications because it might not work as well  For each dose of the medication, you should be taking 3 tablets together (2 pink oval and 1 white oval) TWICE a day for FIVE days   Finish your full five-day course of Paxlovid even if you feel better before you're done. Stopping this medication too early can make it less effective to prevent severe illness related to COVID19.    Paxlovid is prescribed for YOU ONLY. Don't share it with others, even if they have similar symptoms as you. This medication might not be right for everyone.  Make sure to take steps to protect yourself and others while you're taking this medication in order to get well soon and to prevent others from getting sick with COVID-19.  Paxlovid (nirmatrelvir /  ritonavir) can cause hormonal birth control medications to not work well. If you or your partner is currently taking hormonal birth control, use condoms or other birth control methods to prevent unintended pregnancies.    COMMON SIDE EFFECTS: Altered or bad taste in your mouth  Diarrhea  High blood pressure (1% of people) Muscle aches (1% of people)     If your COVID-19 symptoms get worse, get medical help right away. Call 911 if you experience symptoms such as worsening cough, trouble breathing, chest pain that doesn't go away, confusion, a hard time staying awake, and pale or blue-colored skin. This medication won't prevent all COVID-19 cases from getting worse.

## 2021-01-30 NOTE — Progress Notes (Signed)
Virtual Visit Consent   Brett Wheeler, you are scheduled for a virtual visit with Mary-Margaret Daphine Deutscher, FNP, a [facility] provider, today.     Just as with appointments in the office, your consent must be obtained to participate.  Your consent will be active for this visit and any virtual visit you may have with one of our providers in the next 365 days.     If you have a MyChart account, a copy of this consent can be sent to you electronically.  All virtual visits are billed to your insurance company just like a traditional visit in the office.    As this is a virtual visit, video technology does not allow for your provider to perform a traditional examination.  This may limit your provider's ability to fully assess your condition.  If your provider identifies any concerns that need to be evaluated in person or the need to arrange testing (such as labs, EKG, etc.), we will make arrangements to do so.     Although advances in technology are sophisticated, we cannot ensure that it will always work on either your end or our end.  If the connection with a video visit is poor, the visit may have to be switched to a telephone visit.  With either a video or telephone visit, we are not always able to ensure that we have a secure connection.     I need to obtain your verbal consent now.   Are you willing to proceed with your visit today?    Brett Wheeler has provided verbal consent on 01/30/2021 for a virtual visit (video or telephone).   Mary-Margaret Daphine Deutscher, FNP   Date: 01/30/2021 9:32 AM   Virtual Visit via Video Note   I, Mary-Margaret Daphine Deutscher, connected withNAME@ ([id number], 1976-03-18) on 01/30/21 at  9:30 AM EDT by a video-enabled telemedicine application and verified that I am speaking with the correct person using two identifiers.  Location: Patient: Virtual Visit Location Patient: Home Provider: Virtual Visit Location Provider: Mobile   I discussed the limitations of evaluation and  management by telemedicine and the availability of in person appointments. The patient expressed understanding and agreed to proceed.    History of Present Illness: Brett Wheeler is a 45 y.o. who identifies as a male who was assigned male at birth, and is being seen today for Covid.  HPI: HPI  Patient started with cough and congestion on Thursday. Developed fever , headache and body aches on Friday morning. Did video last night with C. Tayveon Lombardo,PA and he was told he needed to do a covid test and call back.. did covid test last night and was positive.    Review of Systems  Constitutional:  Positive for chills, fever and malaise/fatigue.  HENT:  Positive for congestion. Negative for ear pain and sore throat.   Respiratory:  Positive for cough and sputum production.   Musculoskeletal:  Positive for myalgias.  Neurological:  Positive for headaches.   Problems:  Patient Active Problem List   Diagnosis Date Noted   Benign essential hypertension 06/15/2017    Allergies: No Known Allergies Medications:  Current Outpatient Medications:    EPINEPHrine 0.3 mg/0.3 mL IJ SOAJ injection, INJECT INTRAMUSCULARLY AS DIRECTED, Disp: , Rfl:    losartan (COZAAR) 50 MG tablet, Take 1 tablet (50 mg total) by mouth daily., Disp: 90 tablet, Rfl: 1   omeprazole (PRILOSEC) 20 MG capsule, Take 20 mg by mouth daily., Disp: , Rfl:  promethazine-dextromethorphan (PROMETHAZINE-DM) 6.25-15 MG/5ML syrup, Take 5 mLs by mouth 4 (four) times daily as needed for cough., Disp: 118 mL, Rfl: 0  Observations/Objective: Patient is well-developed, well-nourished in no acute distress.  Resting comfortably  at home.  Head is normocephalic, atraumatic.  No labored breathing.  Speech is clear and coherent with logical content.  Patient is alert and oriented at baseline.  Dry cough noted  Assessment and Plan:  Brett Wheeler in today with chief complaint of No chief complaint on file.   1. Lab test positive for detection of  COVID-19 virus 1. Take meds as prescribed 2. Use a cool mist humidifier especially during the winter months and when heat has been humid. 3. Use saline nose sprays frequently 4. Saline irrigations of the nose can be very helpful if done frequently.  * 4X daily for 1 week*  * Use of a nettie pot can be helpful with this. Follow directions with this* 5. Drink plenty of fluids 6. Keep thermostat turn down low 7.For any cough or congestion  Use plain Mucinex- regular strength or max strength is fine   * Children- consult with Pharmacist for dosing 8. For fever or aces or pains- take tylenol or ibuprofen appropriate for age and weight.  * for fevers greater than 101 orally you may alternate ibuprofen and tylenol every  3 hours.   Meds ordered this encounter  Medications   nirmatrelvir/ritonavir EUA (PAXLOVID) TABS    Sig: Take 3 tablets by mouth 2 (two) times daily for 5 days. (Take nirmatrelvir 150 mg two tablets twice daily for 5 days and ritonavir 100 mg one tablet twice daily for 5 days) Patient GFR is >60    Dispense:  30 tablet    Refill:  0    Order Specific Question:   Supervising Provider    Answer:   Eber Hong [[id number]]        Follow Up Instructions: I discussed the assessment and treatment plan with the patient. The patient was provided an opportunity to ask questions and all were answered. The patient agreed with the plan and demonstrated an understanding of the instructions.  A copy of instructions were sent to the patient via MyChart.  The patient was advised to call back or seek an in-person evaluation if the symptoms worsen or if the condition fails to improve as anticipated.  Time:  I spent 12 minutes with the patient via telehealth technology discussing the above problems/concerns.    Mary-Margaret Daphine Deutscher, FNP

## 2021-02-01 ENCOUNTER — Telehealth: Payer: Self-pay

## 2021-02-01 ENCOUNTER — Encounter (INDEPENDENT_AMBULATORY_CARE_PROVIDER_SITE_OTHER): Payer: Self-pay

## 2021-02-01 NOTE — Telephone Encounter (Signed)
Patient states that he has 2 episodes of diarrhea this morning. Patient was advise per protocol as follows on diarrhea:    If diarrhea remains the same: encourage patient to drink oral fluids and bland foods.   Avoid alcohol, spicy foods, caffeine or fatty foods that could make diarrhea worse.   Continue to monitor for signs of dehydration (increased thirst decreased urine output, yellow urine, dry skin, headache or dizziness).   Advise patient to try OTC medication (Imodium, kaopectate, Pepto-Bismol) as per manufacturer's instructions.   If worsening diarrhea occurs and becomes severe (6-7 bowel movements a day): notify PCP   If diarrhea last greater than 7 days: notify PCP   IF SIGNS OF DEHYDRATION OCCUR (INCREASED THIRST, DECREASED URINE OUTPUT, YELLOW URINE, DRY SKIN, HEADACHE OR DIZZINESS) ADVISE PATIENT TO CALL 911 AND SEEK TREATMENT IN THE ED.  Patient verbalized understanding and will continue to monitor.

## 2021-03-24 ENCOUNTER — Encounter: Payer: Self-pay | Admitting: Family Medicine

## 2021-05-04 ENCOUNTER — Encounter: Payer: Self-pay | Admitting: Gastroenterology

## 2021-05-18 ENCOUNTER — Encounter: Payer: Self-pay | Admitting: Family Medicine

## 2021-05-31 ENCOUNTER — Encounter: Payer: 59 | Admitting: Gastroenterology

## 2021-08-08 ENCOUNTER — Telehealth: Payer: 59 | Admitting: Nurse Practitioner

## 2021-08-08 DIAGNOSIS — R112 Nausea with vomiting, unspecified: Secondary | ICD-10-CM

## 2021-08-09 MED ORDER — ONDANSETRON HCL 4 MG PO TABS: 4.0000 mg | ORAL_TABLET | Freq: Three times a day (TID) | ORAL | 0 refills | Status: DC | PRN

## 2021-08-09 NOTE — Progress Notes (Signed)
E-Visit for Nausea and Vomiting   We are sorry that you are not feeling well. Here is how we plan to help!  Based on what you have shared with me it looks like you have a Virus that is irritating your GI tract.  Vomiting is the forceful emptying of a portion of the stomach's content through the mouth.  Although nausea and vomiting can make you feel miserable, it's important to remember that these are not diseases, but rather symptoms of an underlying illness.  When we treat short term symptoms, we always caution that any symptoms that persist should be fully evaluated in a medical office.  I have prescribed a medication that will help alleviate your symptoms and allow you to stay hydrated:  Zofran 4 mg 1 tablet every 8 hours as needed for nausea and vomiting  HOME CARE: Drink clear liquids.  This is very important! Dehydration (the lack of fluid) can lead to a serious complication.  Start off with 1 tablespoon every 5 minutes for 8 hours. You may begin eating bland foods after 8 hours without vomiting.  Start with saltine crackers, white bread, rice, mashed potatoes, applesauce. After 48 hours on a bland diet, you may resume a normal diet. Try to go to sleep.  Sleep often empties the stomach and relieves the need to vomit.  GET HELP RIGHT AWAY IF:  Your symptoms do not improve or worsen within 2 days after treatment. You have a fever for over 3 days. You cannot keep down fluids after trying the medication.  MAKE SURE YOU:  Understand these instructions. Will watch your condition. Will get help right away if you are not doing well or get worse.    Thank you for choosing an e-visit.  Your e-visit answers were reviewed by a board certified advanced clinical practitioner to complete your personal care plan. Depending upon the condition, your plan could have included both over the counter or prescription medications.  Please review your pharmacy choice. Make sure the pharmacy is open so  you can pick up prescription now. If there is a problem, you may contact your provider through [employer] and have the prescription routed to another pharmacy.  Your safety is important to [place]. If you have drug allergies check your prescription carefully.   For the next 24 hours you can use MyChart to ask questions about today's visit, request a non-urgent call back, or ask for a work or school excuse. You will get an email in the next two days asking about your experience. I hope that your e-visit has been valuable and will speed your recovery.   I spent approximately 7 minutes reviewing the patient's history, current symptoms and coordinating their plan of care today.    Meds ordered this encounter  Medications   ondansetron (ZOFRAN) 4 MG tablet    Sig: Take 1 tablet (4 mg total) by mouth every 8 (eight) hours as needed for nausea or vomiting.    Dispense:  20 tablet    Refill:  0

## 2021-09-15 ENCOUNTER — Other Ambulatory Visit: Payer: Self-pay | Admitting: Family Medicine

## 2021-09-15 DIAGNOSIS — I10 Essential (primary) hypertension: Secondary | ICD-10-CM

## 2021-10-16 ENCOUNTER — Other Ambulatory Visit: Payer: Self-pay | Admitting: Family Medicine

## 2021-10-16 DIAGNOSIS — I10 Essential (primary) hypertension: Secondary | ICD-10-CM

## 2021-10-19 ENCOUNTER — Emergency Department (HOSPITAL_COMMUNITY)
Admission: EM | Admit: 2021-10-19 | Discharge: 2021-10-19 | Disposition: A | Discharge status: Home / Self Care | Payer: 59 | Attending: Emergency Medicine | Admitting: Emergency Medicine

## 2021-10-19 DIAGNOSIS — Y92410 Unspecified street and highway as the place of occurrence of the external cause: Secondary | ICD-10-CM | POA: Diagnosis not present

## 2021-10-19 DIAGNOSIS — M542 Cervicalgia: Secondary | ICD-10-CM

## 2021-10-19 DIAGNOSIS — S199XXA Unspecified injury of neck, initial encounter: Secondary | ICD-10-CM | POA: Insufficient documentation

## 2021-10-19 DIAGNOSIS — M545 Low back pain, unspecified: Secondary | ICD-10-CM

## 2021-10-19 DIAGNOSIS — S3992XA Unspecified injury of lower back, initial encounter: Secondary | ICD-10-CM | POA: Diagnosis not present

## 2021-10-19 MED ORDER — METHOCARBAMOL 500 MG PO TABS: 500.0000 mg | ORAL_TABLET | Freq: Two times a day (BID) | ORAL | 0 refills | Status: DC
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
## 2021-11-01 ENCOUNTER — Ambulatory Visit (INDEPENDENT_AMBULATORY_CARE_PROVIDER_SITE_OTHER): Payer: 59 | Admitting: Family Medicine

## 2021-11-01 VITALS — BP 142/88 | HR 90 | Temp 98.4°F | Resp 18 | Ht 76.0 in | Wt 222.2 lb

## 2021-11-01 DIAGNOSIS — I10 Essential (primary) hypertension: Secondary | ICD-10-CM

## 2021-11-01 DIAGNOSIS — Z13220 Encounter for screening for lipoid disorders: Secondary | ICD-10-CM

## 2021-11-01 DIAGNOSIS — Z131 Encounter for screening for diabetes mellitus: Secondary | ICD-10-CM

## 2021-11-01 DIAGNOSIS — Z125 Encounter for screening for malignant neoplasm of prostate: Secondary | ICD-10-CM

## 2021-11-01 DIAGNOSIS — Z1211 Encounter for screening for malignant neoplasm of colon: Secondary | ICD-10-CM

## 2021-11-01 MED ORDER — LOSARTAN POTASSIUM 100 MG PO TABS: 100.0000 mg | ORAL_TABLET | Freq: Every day | ORAL | 3 refills | Status: DC

## 2021-11-01 NOTE — Patient Instructions (Signed)
We will increase your dose of losartan to 100 mg.  Please let me know if your blood pressure continues to be elevated-goal blood pressure less than 135/85 ? ?I will be in touch with your blood work ? ?We will set you up for a colonoscopy ?

## 2021-11-02 ENCOUNTER — Encounter: Payer: Self-pay | Admitting: Family Medicine

## 2021-11-02 DIAGNOSIS — E785 Hyperlipidemia, unspecified: Secondary | ICD-10-CM

## 2021-11-02 LAB — COMPREHENSIVE METABOLIC PANEL
ALT: 32 U/L (ref 0–53)
AST: 18 U/L (ref 0–37)
Albumin: 4.7 g/dL (ref 3.5–5.2)
Alkaline Phosphatase: 56 U/L (ref 39–117)
BUN: 16 mg/dL (ref 6–23)
CO2: 28 mEq/L (ref 19–32)
Calcium: 9.9 mg/dL (ref 8.4–10.5)
Chloride: 101 mEq/L (ref 96–112)
Creatinine, Ser: 1.08 mg/dL (ref 0.40–1.50)
GFR: 81.85 mL/min (ref >60.00)
Glucose, Bld: 101 mg/dL — ABNORMAL HIGH (ref 70–99)
Potassium: 3.9 mEq/L (ref 3.5–5.1)
Sodium: 138 mEq/L (ref 135–145)
Total Bilirubin: 0.5 mg/dL (ref 0.2–1.2)
Total Protein: 6.8 g/dL (ref 6.0–8.3)

## 2021-11-02 LAB — CBC
HCT: 44.3 % (ref 39.0–52.0)
Hemoglobin: 15.3 g/dL (ref 13.0–17.0)
MCHC: 34.5 g/dL (ref 30.0–36.0)
MCV: 86.8 fl (ref 78.0–100.0)
Platelets: 323 10*3/uL (ref 150.0–400.0)
RBC: 5.11 Mil/uL (ref 4.22–5.81)
RDW: 13.1 % (ref 11.5–15.5)
WBC: 9.1 10*3/uL (ref 4.0–10.5)

## 2021-11-02 LAB — LIPID PANEL
Cholesterol: 191 mg/dL (ref 0–200)
HDL: 34.5 mg/dL — ABNORMAL LOW (ref >39.00)
NonHDL: 156.02
Total CHOL/HDL Ratio: 6
Triglycerides: 255 mg/dL — ABNORMAL HIGH (ref 0.0–149.0)
VLDL: 51 mg/dL — ABNORMAL HIGH (ref 0.0–40.0)

## 2021-11-02 LAB — HEMOGLOBIN A1C: Hgb A1c MFr Bld: 5.7 % (ref 4.6–6.5)

## 2021-11-02 LAB — LDL CHOLESTEROL, DIRECT: Direct LDL: 142 mg/dL

## 2021-11-02 LAB — PSA: PSA: 0.62 ng/mL (ref 0.10–4.00)

## 2021-11-07 MED ORDER — PRAVASTATIN SODIUM 20 MG PO TABS: 20.0000 mg | ORAL_TABLET | Freq: Every day | ORAL | 3 refills | Status: DC

## 2022-04-25 ENCOUNTER — Telehealth: Payer: 59 | Admitting: Physician Assistant

## 2022-04-25 DIAGNOSIS — J208 Acute bronchitis due to other specified organisms: Secondary | ICD-10-CM

## 2022-04-25 DIAGNOSIS — B9689 Other specified bacterial agents as the cause of diseases classified elsewhere: Secondary | ICD-10-CM | POA: Diagnosis not present

## 2022-04-26 MED ORDER — BENZONATATE 100 MG PO CAPS: 100.0000 mg | ORAL_CAPSULE | Freq: Three times a day (TID) | ORAL | 0 refills | Status: DC | PRN

## 2022-04-26 MED ORDER — ALBUTEROL SULFATE (2.5 MG/3ML) 0.083% IN NEBU: 2.5000 mg | INHALATION_SOLUTION | Freq: Four times a day (QID) | RESPIRATORY_TRACT | 1 refills | Status: DC | PRN

## 2022-04-26 MED ORDER — AZITHROMYCIN 250 MG PO TABS: ORAL_TABLET | ORAL | 0 refills | Status: AC

## 2022-04-26 NOTE — Progress Notes (Signed)
I have spent 5 minutes in review of e-visit questionnaire, review and updating patient chart, medical decision making and response to patient.   Tsering Leaman Cody Mahlia Fernando, PA-C    

## 2022-04-26 NOTE — Progress Notes (Signed)
We are sorry that you are not feeling well.  Here is how we plan to help!  Based on your presentation I believe you most likely have A cough due to bacteria.  When patients have a fever and a productive cough with a change in color or increased sputum production, we are concerned about bacterial bronchitis.  If left untreated it can progress to pneumonia.  If your symptoms do not improve with your treatment plan it is important that you contact your provider.   I have prescribed Azithromyin 250 mg: two tablets now and then one tablet daily for 4 additonal days    In addition you may use A prescription cough medication called Tessalon Perles 100mg . You may take 1-2 capsules every 8 hours as needed for your cough. I have also sent in albuterol nebulizer solution to use as directed, if needed  From your responses in the eVisit questionnaire you describe inflammation in the upper respiratory tract which is causing a significant cough.  This is commonly called Bronchitis and has four common causes:   Allergies Viral Infections Acid Reflux Bacterial Infection Allergies, viruses and acid reflux are treated by controlling symptoms or eliminating the cause. An example might be a cough caused by taking certain blood pressure medications. You stop the cough by changing the medication. Another example might be a cough caused by acid reflux. Controlling the reflux helps control the cough.  USE OF BRONCHODILATOR ("RESCUE") INHALERS: There is a risk from using your bronchodilator too frequently.  The risk is that over-reliance on a medication which only relaxes the muscles surrounding the breathing tubes can reduce the effectiveness of medications prescribed to reduce swelling and congestion of the tubes themselves.  Although you feel brief relief from the bronchodilator inhaler, your asthma may actually be worsening with the tubes becoming more swollen and filled with mucus.  This can delay other crucial  treatments, such as oral steroid medications. If you need to use a bronchodilator inhaler daily, several times per day, you should discuss this with your provider.  There are probably better treatments that could be used to keep your asthma under control.     HOME CARE Only take medications as instructed by your medical team. Complete the entire course of an antibiotic. Drink plenty of fluids and get plenty of rest. Avoid close contacts especially the very young and the elderly Cover your mouth if you cough or cough into your sleeve. Always remember to wash your hands A steam or ultrasonic humidifier can help congestion.   GET HELP RIGHT AWAY IF: You develop worsening fever. You become short of breath You cough up blood. Your symptoms persist after you have completed your treatment plan MAKE SURE YOU  Understand these instructions. Will watch your condition. Will get help right away if you are not doing well or get worse.    Thank you for choosing an e-visit.  Your e-visit answers were reviewed by a board certified advanced clinical practitioner to complete your personal care plan. Depending upon the condition, your plan could have included both over the counter or prescription medications.  Please review your pharmacy choice. Make sure the pharmacy is open so you can pick up prescription now. If there is a problem, you may contact your provider through and have the prescription routed to another pharmacy.  Your safety is important to [employer]. If you have drug allergies check your prescription carefully.   For the next 24 hours you can use MyChart  to ask questions about today's visit, request a non-urgent call back, or ask for a work or school excuse. You will get an email in the next two days asking about your experience. I hope that your e-visit has been valuable and will speed your recovery.

## 2022-05-08 ENCOUNTER — Telehealth: Payer: 59 | Admitting: Physician Assistant

## 2022-05-08 DIAGNOSIS — R051 Acute cough: Secondary | ICD-10-CM

## 2022-05-08 DIAGNOSIS — J029 Acute pharyngitis, unspecified: Secondary | ICD-10-CM

## 2022-05-09 NOTE — Progress Notes (Signed)
Because you were recently treated with an antibiotic for bronchitis and now having new symptoms, I feel your condition warrants further evaluation and I recommend that you be seen in a face to face visit.   NOTE: There will be NO CHARGE for this eVisit   If you are having a true medical emergency please call 911.      For an urgent face to face visit, [place] has seven urgent care centers for your convenience:     [facility] [place] [facility] [place] Get Driving Directions [phone] [address] [place], [place] [address]    [facility]) Get Driving Directions [phone] [place], [place] [address]  [facility] [place] [facility]) Get Driving Directions [phone] [address] [place],  [place]  [address]  [facility] - at [place] Get Driving Directions  [phone] [address] [place],  [place] [address]   [facility]  Get Driving Directions [phone] [address] [place], [place], [place] [address]   [facility] [place] Get Driving Directions  [phone] [address] [place],  [address]   [facility] [place] Get Driving Directions [phone] [address] [place],  [address]  Your MyChart E-visit questionnaire answers were reviewed by a board certified advanced clinical practitioner to complete your personal care plan based on your specific symptoms.  Thank you for using e-Visits.   I provided 5 minutes of non face-to-face time during this encounter for chart review and documentation.

## 2022-05-10 ENCOUNTER — Ambulatory Visit: Payer: 59 | Admitting: Family

## 2022-05-10 ENCOUNTER — Ambulatory Visit (HOSPITAL_BASED_OUTPATIENT_CLINIC_OR_DEPARTMENT_OTHER)
Admission: RE | Admit: 2022-05-10 | Discharge: 2022-05-10 | Disposition: A | Discharge status: Home / Self Care | Payer: 59 | Source: Ambulatory Visit | Attending: Family | Admitting: Family

## 2022-05-10 ENCOUNTER — Encounter: Payer: Self-pay | Admitting: Family

## 2022-05-10 VITALS — BP 122/80 | HR 100 | Temp 97.7°F | Resp 20 | Ht 76.0 in | Wt 213.2 lb

## 2022-05-10 DIAGNOSIS — R053 Chronic cough: Secondary | ICD-10-CM

## 2022-05-10 DIAGNOSIS — J029 Acute pharyngitis, unspecified: Secondary | ICD-10-CM

## 2022-05-10 DIAGNOSIS — J9801 Acute bronchospasm: Secondary | ICD-10-CM | POA: Diagnosis not present

## 2022-05-10 LAB — POCT RAPID STREP A (OFFICE): Rapid Strep A Screen: NEGATIVE

## 2022-05-10 MED ORDER — AMOXICILLIN-POT CLAVULANATE 875-125 MG PO TABS: 1.0000 | ORAL_TABLET | Freq: Two times a day (BID) | ORAL | 0 refills | Status: AC

## 2022-05-10 MED ORDER — PREDNISONE 20 MG PO TABS: 20.0000 mg | ORAL_TABLET | Freq: Every day | ORAL | 0 refills | Status: DC

## 2022-05-10 NOTE — Progress Notes (Signed)
Brett Wheeler is a 46 y.o. male with the following history as recorded in EpicCare:  Patient Active Problem List   Diagnosis Date Noted   Benign essential hypertension 06/15/2017    Current Outpatient Medications  Medication Sig Dispense Refill   amoxicillin-clavulanate (AUGMENTIN) 875-125 MG tablet Take 1 tablet by mouth 2 (two) times daily for 10 days. 20 tablet 0   EPINEPHrine 0.3 mg/0.3 mL IJ SOAJ injection INJECT INTRAMUSCULARLY AS DIRECTED     losartan (COZAAR) 100 MG tablet Take 1 tablet (100 mg total) by mouth daily. 90 tablet 3   omeprazole (PRILOSEC) 20 MG capsule Take 20 mg by mouth daily.     pravastatin (PRAVACHOL) 20 MG tablet Take 1 tablet (20 mg total) by mouth daily. 90 tablet 3   predniSONE (DELTASONE) 20 MG tablet Take 1 tablet (20 mg total) by mouth daily with breakfast. 5 tablet 0   No current facility-administered medications for this visit.    Allergies: Patient has no known allergies.  Past Medical History:  Diagnosis Date   Hypertension     Past Surgical History:  Procedure Laterality Date   HERNIA REPAIR      Family History  Problem Relation Age of Onset   Cancer Maternal Grandfather    Lymphoma Paternal Grandfather     Social History   Tobacco Use   Smoking status: Never   Smokeless tobacco: Never  Substance Use Topics   Alcohol use: No    Subjective:   Accompanied by his wife; was treated for acute bronchitis with Z-pak 2 weeks ago with limited benefit; concerned due to persisting symptoms; did try using nebulizer at home but was unable to tolerate; notes that sore throat has become problematic in the past 2 days;     Objective:  Vitals:   05/10/22 0945  BP: 122/80  Pulse: 100  Resp: 20  Temp: 97.7 F (36.5 C)  TempSrc: Oral  SpO2: 98%  Weight: 213 lb 3.2 oz (96.7 kg)  Height: 6\' 4"  (1.93 m)    General: Well developed, well nourished, in no acute distress  Skin : Warm and dry.  Head: Normocephalic and atraumatic  Eyes: Sclera  and conjunctiva clear; pupils round and reactive to light; extraocular movements intact  Ears: External normal; canals clear; tympanic membranes normal  Oropharynx: Pink, supple. No suspicious lesions posterior erythema;  Neck: Supple without thyromegaly, adenopathy  Lungs: Respirations unlabored; clear to auscultation bilaterally without wheeze, rales, rhonchi  CVS exam: normal rate and regular rhythm.  Neurologic: Alert and oriented; speech intact; face symmetrical; moves all extremities well; CNII-XII intact without focal deficit   Assessment:  1. Persistent cough for 3 weeks or longer   2. Acute bronchospasm     Plan:  Rapid strep negative; will update CXR due to length of time symptoms have been present;  Rx for Augmentin and Prednisone; increase fluids, rest and follow up worse, no better.   No follow-ups on file.  Orders Placed This Encounter  Procedures   DG Chest 2 View    Standing Status:   Future    Number of Occurrences:   1    Standing Expiration Date:   05/11/2023    Order Specific Question:   Reason for Exam (SYMPTOM  OR DIAGNOSIS REQUIRED)    Answer:   persistent cough    Order Specific Question:   Preferred imaging location?    Answer:   [facility]    Requested Prescriptions   Signed Prescriptions Disp  Refills   predniSONE (DELTASONE) 20 MG tablet 5 tablet 0    Sig: Take 1 tablet (20 mg total) by mouth daily with breakfast.   amoxicillin-clavulanate (AUGMENTIN) 875-125 MG tablet 20 tablet 0    Sig: Take 1 tablet by mouth 2 (two) times daily for 10 days.

## 2022-05-10 NOTE — Addendum Note (Signed)
Addended by: Kittie Plater, Sailor Haughn HUA on: 05/10/2022 03:36 PM   Modules accepted: Orders

## 2022-08-24 ENCOUNTER — Ambulatory Visit: Payer: 59 | Admitting: Family Medicine

## 2022-08-29 NOTE — Progress Notes (Signed)
[facility] at [facility] [address] [place], [place] [address] [phone]  Date:  08/31/2022   Name:  Brett Wheeler   DOB:  Dec 04, 1975   MRN:  [id number]  PCP:  Darreld Mclean, MD    Chief Complaint: Medication Refill (Medication )   History of Present Illness:  Brett Wheeler is a 47 y.o. very pleasant male patient who presents with the following:  Patient seen today for follow-up visit-history of hypertension, dyslipidemia They ended up changing to a virtual visit.  Patient location is home, my location is office.  Patient identity confirmed with 2 factors, he states permission for virtual visit today.  The patient and myself are present on the call today  Most recently seen by myself in March of this year-at that time he had noted some elevated blood pressures when he went to donate plasma, we increased his losartan from 50 to 100 mg daily I referred him for colonoscopy at that time but this did not get completed- he agrees to call them and work on getting this done  Most recent labs in March-we started pravastatin at that time for dyslipidemia  He has not checked his BP recently- he will try and find his cuff and let me know how his numbers look  Can offer flu shot- pt seen virtually  He did have an allergic reaction years ago- he was seen by allergist and they could not figure out what might be allergic to.  In any case he has not had any more allergic reactions.  He does not wish to continue filling an EpiPen prescription Patient Active Problem List   Diagnosis Date Noted   Benign essential hypertension 06/15/2017    Past Medical History:  Diagnosis Date   Hypertension     Past Surgical History:  Procedure Laterality Date   HERNIA REPAIR      Social History   Tobacco Use   Smoking status: Never   Smokeless tobacco: Never  Vaping Use   Vaping Use: Never used  Substance Use Topics   Alcohol use: No   Drug  use: No    Family History  Problem Relation Age of Onset   Cancer Maternal Grandfather    Lymphoma Paternal Grandfather     No Known Allergies  Medication list has been reviewed and updated.  Current Outpatient Medications on File Prior to Visit  Medication Sig Dispense Refill   losartan (COZAAR) 100 MG tablet Take 1 tablet (100 mg total) by mouth daily. 90 tablet 3   omeprazole (PRILOSEC) 20 MG capsule Take 20 mg by mouth daily.     pravastatin (PRAVACHOL) 20 MG tablet Take 1 tablet (20 mg total) by mouth daily. 90 tablet 3   No current facility-administered medications on file prior to visit.    Review of Systems:  As per HPI- otherwise negative.   Physical Examination: There were no vitals filed for this visit. There were no vitals filed for this visit. There is no height or weight on file to calculate BMI. Ideal Body Weight:    Patient observed via video monitor.  He looks well, his normal self.  No distress or shortness of breath is noted  Assessment and Plan: Dyslipidemia - Plan: Lipid panel  Essential hypertension - Plan: CBC, Comprehensive metabolic panel  Screening for prostate cancer - Plan: PSA  Screening for diabetes mellitus - Plan: Hemoglobin A1c  Screening for colon cancer  Lab work is ordered, I asked him to schedule lab visit with [place] at his earliest convenience.  He is also asked to please check his blood pressure at home and let me know how it looks I encouraged him to get in touch with GI and schedule his colon cancer screening Will plan further follow- up pending labs.   Signed Lamar Blinks, MD

## 2022-08-29 NOTE — Patient Instructions (Signed)
It was good to see you again today, I will be in touch with your labs  Please set up a visit with your GI doc- Dr Smith - for your screening colonoscopy  [phone]  Please check with your insurance about coverage of GLP-1 agonist drugs for weight loss.  These drugs would include Saxenda, Wegovy, Zepbound  Please clarify with your insurance if they cover this for weight loss as opposed to diabetes.  If one of these medications is covered I am more than happy to prescribe you 

## 2022-08-31 ENCOUNTER — Telehealth: Payer: 59 | Admitting: Family Medicine

## 2022-08-31 DIAGNOSIS — Z131 Encounter for screening for diabetes mellitus: Secondary | ICD-10-CM | POA: Diagnosis not present

## 2022-08-31 DIAGNOSIS — I10 Essential (primary) hypertension: Secondary | ICD-10-CM

## 2022-08-31 DIAGNOSIS — E785 Hyperlipidemia, unspecified: Secondary | ICD-10-CM | POA: Diagnosis not present

## 2022-08-31 DIAGNOSIS — Z125 Encounter for screening for malignant neoplasm of prostate: Secondary | ICD-10-CM

## 2022-08-31 DIAGNOSIS — Z1211 Encounter for screening for malignant neoplasm of colon: Secondary | ICD-10-CM

## 2022-09-20 ENCOUNTER — Encounter: Payer: Self-pay | Admitting: Family Medicine

## 2022-09-28 ENCOUNTER — Encounter: Payer: Self-pay | Admitting: Family Medicine

## 2022-09-28 ENCOUNTER — Other Ambulatory Visit: Payer: Self-pay | Admitting: Family Medicine

## 2022-09-28 ENCOUNTER — Other Ambulatory Visit (INDEPENDENT_AMBULATORY_CARE_PROVIDER_SITE_OTHER): Payer: 59

## 2022-09-28 DIAGNOSIS — Z131 Encounter for screening for diabetes mellitus: Secondary | ICD-10-CM | POA: Diagnosis not present

## 2022-09-28 DIAGNOSIS — I10 Essential (primary) hypertension: Secondary | ICD-10-CM | POA: Diagnosis not present

## 2022-09-28 DIAGNOSIS — Z125 Encounter for screening for malignant neoplasm of prostate: Secondary | ICD-10-CM | POA: Diagnosis not present

## 2022-09-28 DIAGNOSIS — E785 Hyperlipidemia, unspecified: Secondary | ICD-10-CM

## 2022-09-28 LAB — LIPID PANEL
Cholesterol: 141 mg/dL (ref 0–200)
HDL: 33 mg/dL — ABNORMAL LOW (ref >39.00)
LDL Cholesterol: 89 mg/dL (ref 0–99)
NonHDL: 107.6
Total CHOL/HDL Ratio: 4
Triglycerides: 94 mg/dL (ref 0.0–149.0)
VLDL: 18.8 mg/dL (ref 0.0–40.0)

## 2022-09-28 LAB — PSA: PSA: 0.69 ng/mL (ref 0.10–4.00)

## 2022-09-28 LAB — CBC
HCT: 44.1 % (ref 39.0–52.0)
Hemoglobin: 15.2 g/dL (ref 13.0–17.0)
MCHC: 34.4 g/dL (ref 30.0–36.0)
MCV: 87.2 fl (ref 78.0–100.0)
Platelets: 286 10*3/uL (ref 150.0–400.0)
RBC: 5.05 Mil/uL (ref 4.22–5.81)
RDW: 13 % (ref 11.5–15.5)
WBC: 5.4 10*3/uL (ref 4.0–10.5)

## 2022-09-28 LAB — COMPREHENSIVE METABOLIC PANEL
ALT: 25 U/L (ref 0–53)
AST: 16 U/L (ref 0–37)
Albumin: 4.3 g/dL (ref 3.5–5.2)
Alkaline Phosphatase: 57 U/L (ref 39–117)
BUN: 10 mg/dL (ref 6–23)
CO2: 30 mEq/L (ref 19–32)
Calcium: 9.7 mg/dL (ref 8.4–10.5)
Chloride: 103 mEq/L (ref 96–112)
Creatinine, Ser: 1.17 mg/dL (ref 0.40–1.50)
GFR: 73.88 mL/min (ref >60.00)
Glucose, Bld: 135 mg/dL — ABNORMAL HIGH (ref 70–99)
Potassium: 4.4 mEq/L (ref 3.5–5.1)
Sodium: 139 mEq/L (ref 135–145)
Total Bilirubin: 0.6 mg/dL (ref 0.2–1.2)
Total Protein: 6.7 g/dL (ref 6.0–8.3)

## 2022-09-28 LAB — HEMOGLOBIN A1C: Hgb A1c MFr Bld: 5.7 % (ref 4.6–6.5)

## 2022-09-28 MED ORDER — PRAVASTATIN SODIUM 20 MG PO TABS: 20.0000 mg | ORAL_TABLET | Freq: Every day | ORAL | 3 refills | Status: DC

## 2022-09-28 MED ORDER — LOSARTAN POTASSIUM 100 MG PO TABS: 100.0000 mg | ORAL_TABLET | Freq: Every day | ORAL | 3 refills | Status: DC

## 2022-09-28 NOTE — Progress Notes (Signed)
Results for orders placed or performed in visit on 09/28/22  PSA  Result Value Ref Range   PSA 0.69 0.10 - 4.00 ng/mL  Lipid panel  Result Value Ref Range   Cholesterol 141 0 - 200 mg/dL   Triglycerides 94.0 0.0 - 149.0 mg/dL   HDL 33.00 (L) >39.00 mg/dL   VLDL 18.8 0.0 - 40.0 mg/dL   LDL Cholesterol 89 0 - 99 mg/dL   Total CHOL/HDL Ratio 4    NonHDL 107.60   Hemoglobin A1c  Result Value Ref Range   Hgb A1c MFr Bld 5.7 4.6 - 6.5 %  Comprehensive metabolic panel  Result Value Ref Range   Sodium 139 135 - 145 mEq/L   Potassium 4.4 3.5 - 5.1 mEq/L   Chloride 103 96 - 112 mEq/L   CO2 30 19 - 32 mEq/L   Glucose, Bld 135 (H) 70 - 99 mg/dL   BUN 10 6 - 23 mg/dL   Creatinine, Ser 1.17 0.40 - 1.50 mg/dL   Total Bilirubin 0.6 0.2 - 1.2 mg/dL   Alkaline Phosphatase 57 39 - 117 U/L   AST 16 0 - 37 U/L   ALT 25 0 - 53 U/L   Total Protein 6.7 6.0 - 8.3 g/dL   Albumin 4.3 3.5 - 5.2 g/dL   GFR 73.88 >60.00 mL/min   Calcium 9.7 8.4 - 10.5 mg/dL  CBC  Result Value Ref Range   WBC 5.4 4.0 - 10.5 K/uL   RBC 5.05 4.22 - 5.81 Mil/uL   Platelets 286.0 150.0 - 400.0 K/uL   Hemoglobin 15.2 13.0 - 17.0 g/dL   HCT 44.1 39.0 - 52.0 %   MCV 87.2 78.0 - 100.0 fl   MCHC 34.4 30.0 - 36.0 g/dL   RDW 13.0 11.5 - 15.5 %    Lab Results  Component Value Date   PSA 0.69 09/28/2022   PSA 0.62 11/01/2021

## 2023-01-18 IMAGING — US US SCROTUM W/ DOPPLER COMPLETE
1 series · 14 of 25 positions shown · non-contrast
Comparison: None.

CLINICAL DATA: Left testicular enlargement.  Question hydrocele.

EXAM:
SCROTAL ULTRASOUND
DOPPLER ULTRASOUND OF THE TESTICLES
TECHNIQUE: Complete ultrasound examination of the testicles, epididymis, and
other scrotal structures was performed. Color and spectral Doppler
ultrasound were also utilized to evaluate blood flow to the
testicles.

[Series 1: us scrotum w/ doppler complete · 14 of 33 slices shown]
[im 1/33]
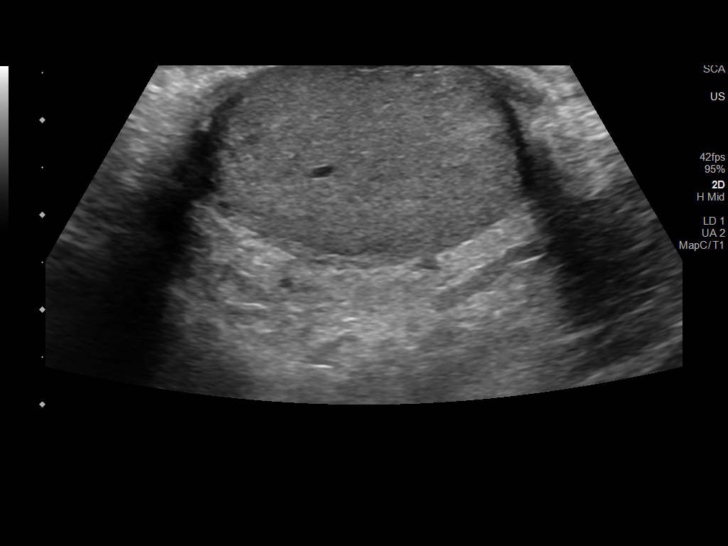
[im 3/33]
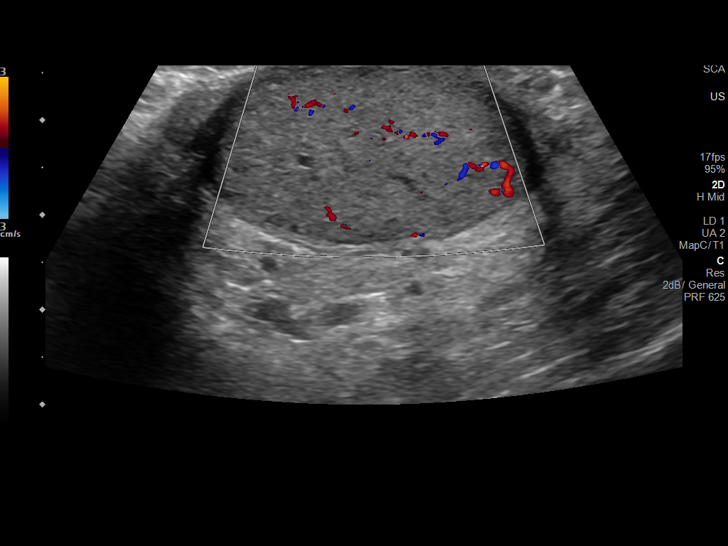
[im 6/33]
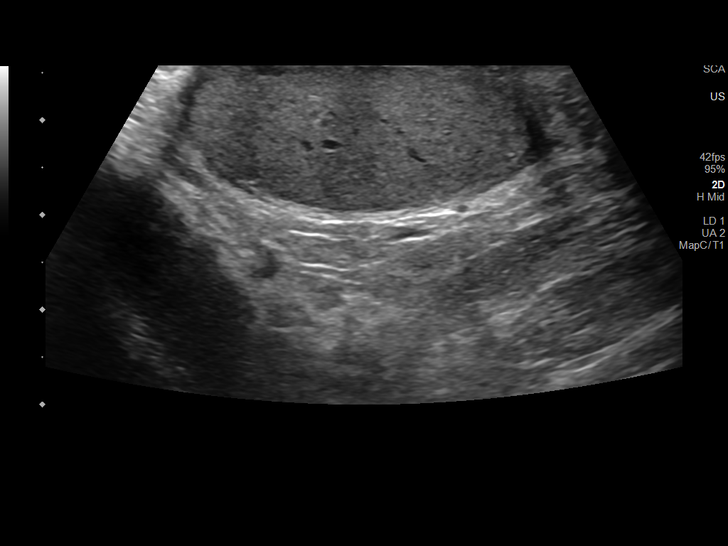
[im 9/33]
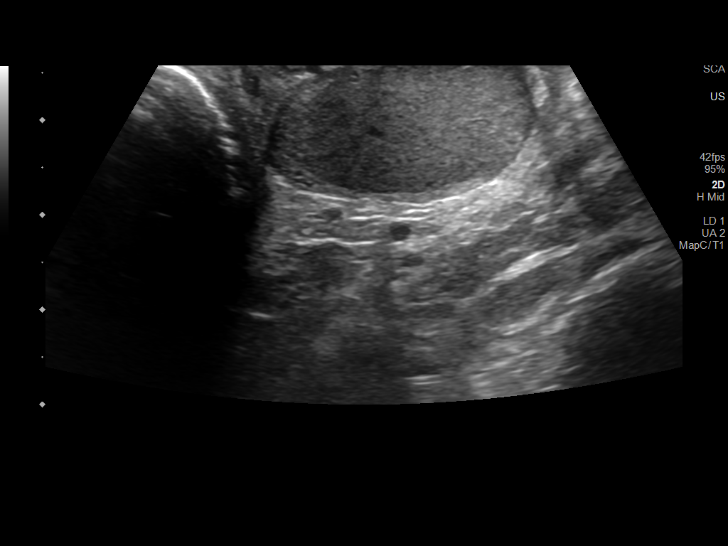
[im 11/33]
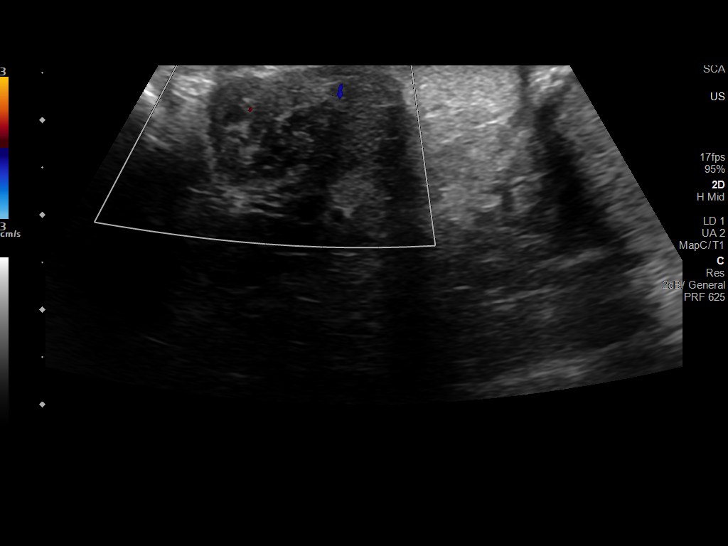
[im 13/33]
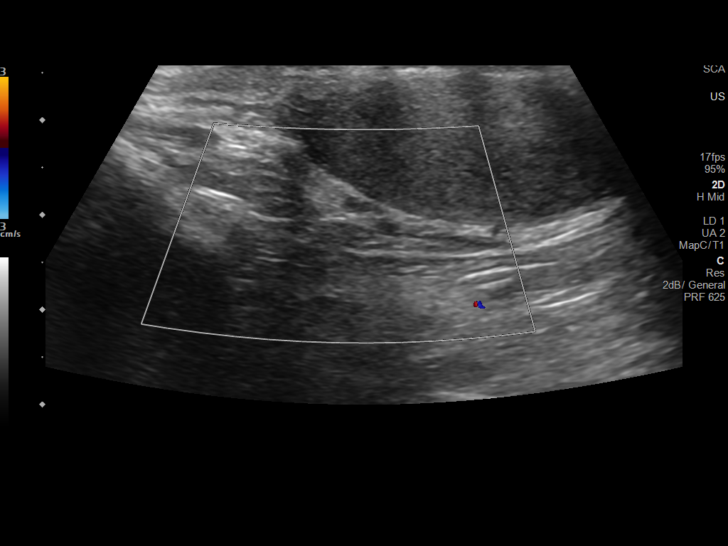
[im 15/33]
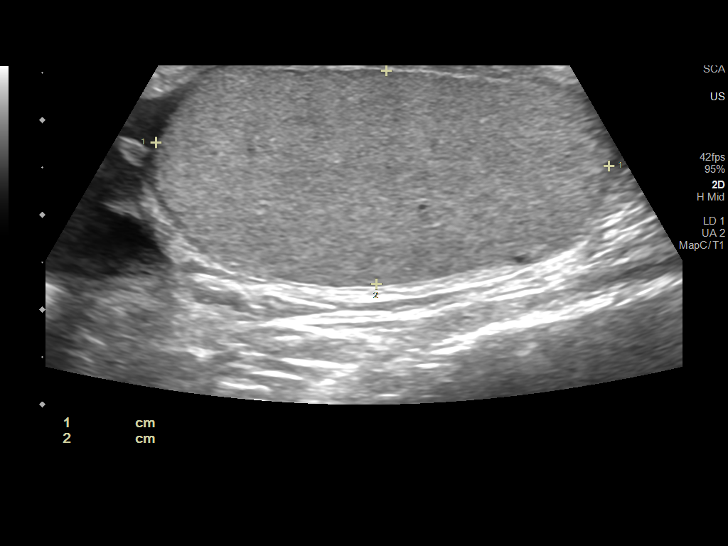
[im 18/33]
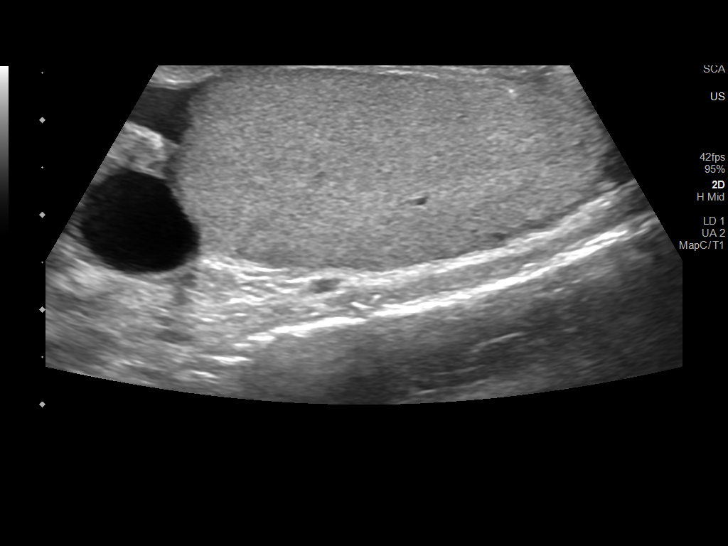
[im 21/33]
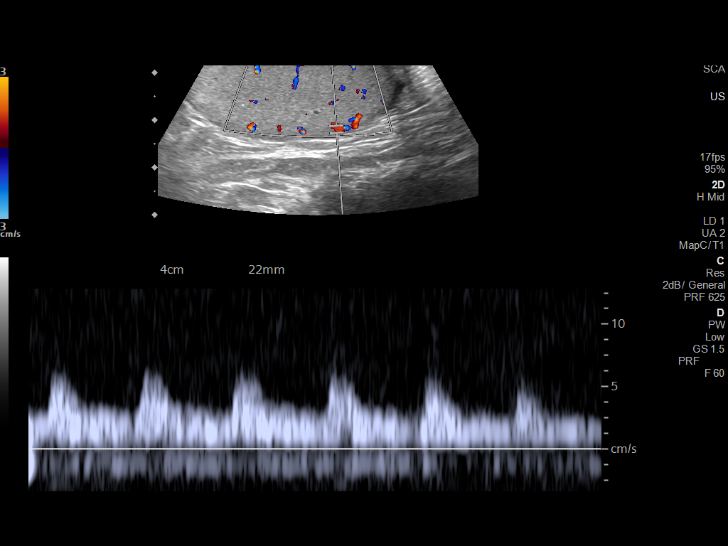
[im 22/33]
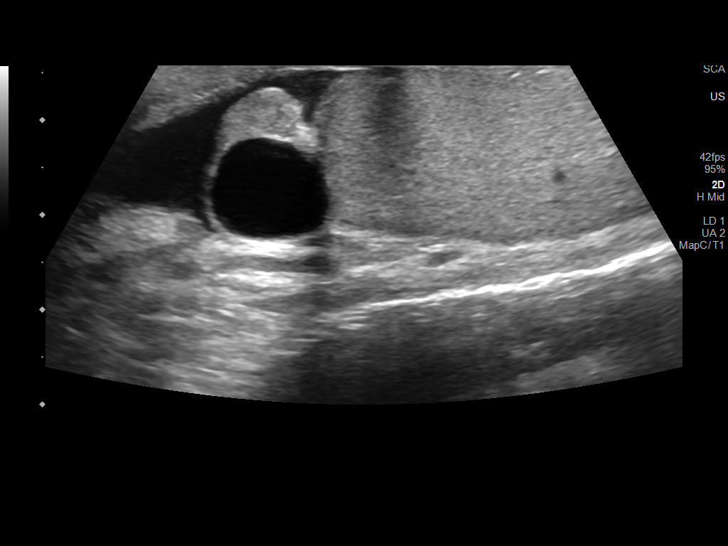
[im 25/33]
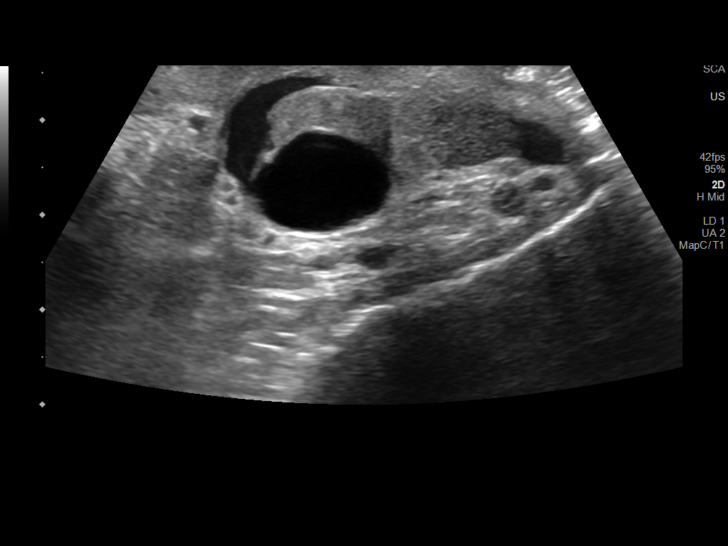
[im 27/33]
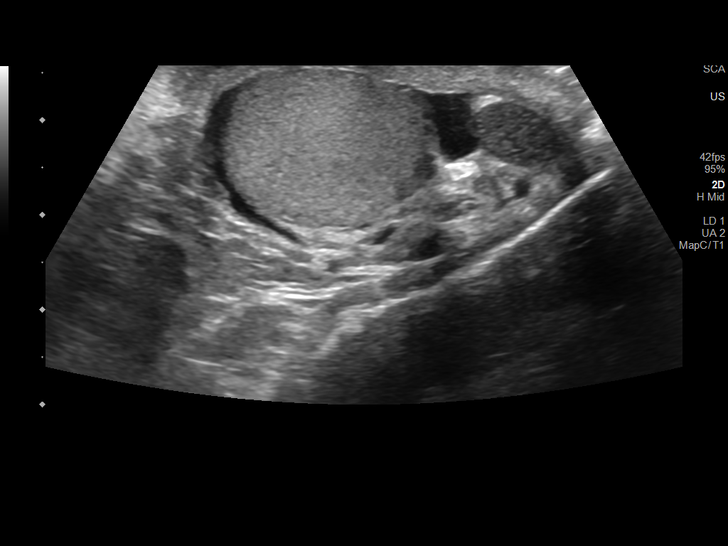
[im 30/33]
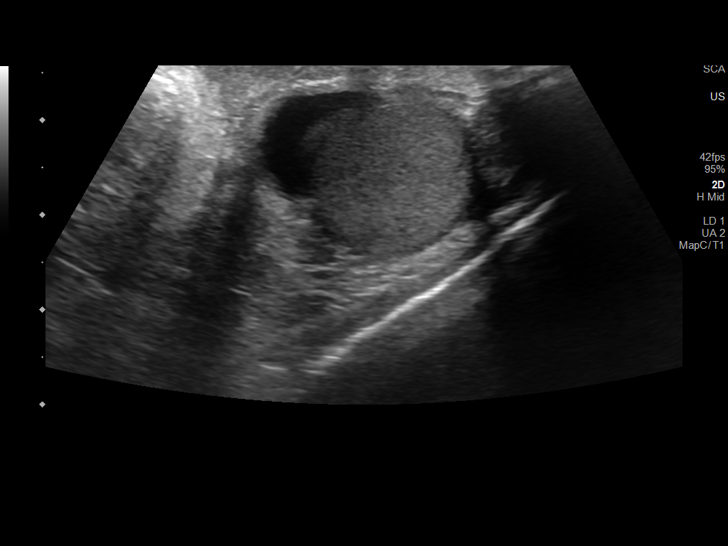
[im 33/33]
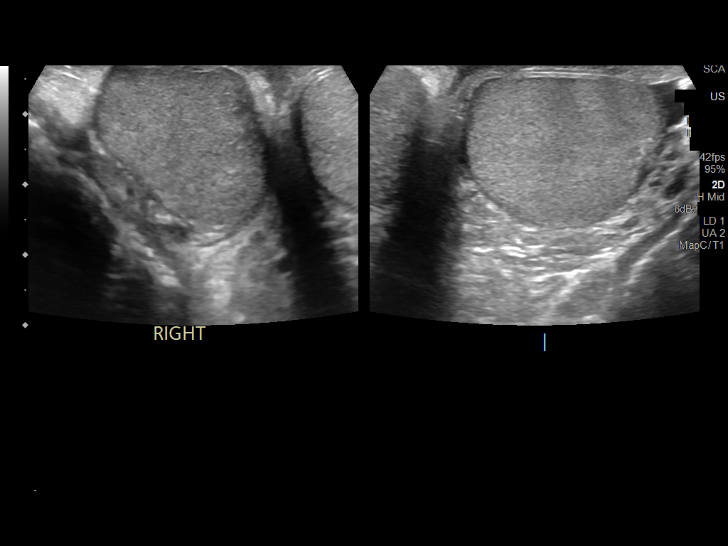

[14 of 25 positions shown; findings below may reference images not displayed]

FINDINGS: Right testicle

Measurements: 3.2 x 2 x 2.5 cm. No mass or microlithiasis
visualized.

Left testicle

Measurements: 4.8 x 2.3 x 3 cm. No mass or microlithiasis
visualized.

The side-by-side image demonstrates similar echogenicity and size of
the testicles.

Right epididymis:  Normal in size and appearance.

Left epididymis: Normal in size and appearance. There is a 1.2 x
x 1.4 cm epididymal head cyst.

Hydrocele:  Small left hydrocele.  No right hydrocele.

Varicocele:  None visualized.

Pulsed doppler interrogation of both testes demonstrates normal low
resistance arterial and venous waveforms bilaterally.
IMPRESSION: 1. Small left hydrocele.
2. Otherwise unremarkable scrotal ultrasound.

## 2023-02-04 ENCOUNTER — Telehealth: Payer: 59 | Admitting: Nurse Practitioner

## 2023-02-04 DIAGNOSIS — W57XXXA Bitten or stung by nonvenomous insect and other nonvenomous arthropods, initial encounter: Secondary | ICD-10-CM

## 2023-02-04 DIAGNOSIS — S90562A Insect bite (nonvenomous), left ankle, initial encounter: Secondary | ICD-10-CM | POA: Diagnosis not present

## 2023-02-04 MED ORDER — SULFAMETHOXAZOLE-TRIMETHOPRIM 800-160 MG PO TABS: 1.0000 | ORAL_TABLET | Freq: Two times a day (BID) | ORAL | 0 refills | Status: DC

## 2023-02-04 NOTE — Patient Instructions (Signed)
  Jarold Motto, thank you for joining Bennie Pierini, FNP for today's virtual visit.  While this provider is not your primary care provider (PCP), if your PCP is located in our provider database this encounter information will be shared with them immediately following your visit.   A [facility] MyChart account gives you access to today's visit and all your visits, tests, and labs performed at [facility] " click here if you don't have a [facility] MyChart account or go to [email or web address]  Consent: (Patient) Jarold Motto provided verbal consent for this virtual visit at the beginning of the encounter.  Current Medications:  Current Outpatient Medications:    sulfamethoxazole-trimethoprim (BACTRIM DS) 800-160 MG tablet, Take 1 tablet by mouth 2 (two) times daily., Disp: 20 tablet, Rfl: 0   losartan (COZAAR) 100 MG tablet, Take 1 tablet (100 mg total) by mouth daily., Disp: 90 tablet, Rfl: 3   omeprazole (PRILOSEC) 20 MG capsule, Take 20 mg by mouth daily., Disp: , Rfl:    pravastatin (PRAVACHOL) 20 MG tablet, Take 1 tablet (20 mg total) by mouth daily., Disp: 90 tablet, Rfl: 3   Medications ordered in this encounter:  Meds ordered this encounter  Medications   sulfamethoxazole-trimethoprim (BACTRIM DS) 800-160 MG tablet    Sig: Take 1 tablet by mouth 2 (two) times daily.    Dispense:  20 tablet    Refill:  0    Order Specific Question:   Supervising Provider    Answer:   Merrilee Jansky [id number]     *If you need refills on other medications prior to your next appointment, please contact your pharmacy*  Follow-Up: Call back or seek an in-person evaluation if the symptoms worsen or if the condition fails to improve as anticipated.  [facility] [phone]  Other Instructions Possible snake bite Soak in epsom salt daily Take all antibiotics If not  improving needs face ti face visit   If you have been instructed to have an in-person  evaluation today at a local Urgent Care facility, please use the link below. It will take you to a list of all of our available [facility], including address, phone number and hours of operation. Please do not delay care.  [facility]  If you or a family member do not have a primary care provider, use the link below to schedule a visit and establish care. When you choose a [facility] primary care physician or advanced practice provider, you gain a long-term partner in health. Find a Primary Care Provider  Learn more about [facility] in-office and virtual care options:  - Get Care Now

## 2023-02-04 NOTE — Progress Notes (Signed)
Virtual Visit Consent   Brett Wheeler, you are scheduled for a virtual visit with Mary-Margaret Daphine Deutscher, FNP, a [facility] provider, today.     Just as with appointments in the office, your consent must be obtained to participate.  Your consent will be active for this visit and any virtual visit you may have with one of our providers in the next 365 days.     If you have a MyChart account, a copy of this consent can be sent to you electronically.  All virtual visits are billed to your insurance company just like a traditional visit in the office.    As this is a virtual visit, video technology does not allow for your provider to perform a traditional examination.  This may limit your provider's ability to fully assess your condition.  If your provider identifies any concerns that need to be evaluated in person or the need to arrange testing (such as labs, EKG, etc.), we will make arrangements to do so.     Although advances in technology are sophisticated, we cannot ensure that it will always work on either your end or our end.  If the connection with a video visit is poor, the visit may have to be switched to a telephone visit.  With either a video or telephone visit, we are not always able to ensure that we have a secure connection.     I need to obtain your verbal consent now.   Are you willing to proceed with your visit today? YES   Brett Wheeler has provided verbal consent on 02/04/2023 for a virtual visit (video or telephone).   Mary-Margaret Daphine Deutscher, FNP   Date: 02/04/2023 8:19 AM   Virtual Visit via Video Note   I, Mary-Margaret Daphine Deutscher, connected with Brett Wheeler ([id number], [birth year]) (47) on 02/04/23 at  8:30 AM EDT by a video-enabled telemedicine application and verified that I am speaking with the correct person using two identifiers.  Location: Patient: Virtual Visit Location Patient: Home Provider: Virtual Visit Location Provider: Mobile   I discussed the limitations of  evaluation and management by telemedicine and the availability of in person appointments. The patient expressed understanding and agreed to proceed.    History of Present Illness: Brett Wheeler is a 47 y.o. who identifies as a male who was assigned male at birth, and is being seen today for insect bite.  HPI: Patient has a insect bite on left ankle. He noticed the bite on Monday. Area is red raised and sore to  touch. Denies drainage.    ROS  Problems:  Patient Active Problem List   Diagnosis Date Noted   Benign essential hypertension 06/15/2017    Allergies: No Known Allergies Medications:  Current Outpatient Medications:    losartan (COZAAR) 100 MG tablet, Take 1 tablet (100 mg total) by mouth daily., Disp: 90 tablet, Rfl: 3   omeprazole (PRILOSEC) 20 MG capsule, Take 20 mg by mouth daily., Disp: , Rfl:    pravastatin (PRAVACHOL) 20 MG tablet, Take 1 tablet (20 mg total) by mouth daily., Disp: 90 tablet, Rfl: 3  Observations/Objective: Patient is well-developed, well-nourished in no acute distress.  Resting comfortably  at home.  Head is normocephalic, .  No labored breathing.  Speech is clear and coherent with logical content.  Patient is alert and oriented at baseline.  2 puncture wound sto left inner ankle with surrounding erythema that goes up leg to knee.  Assessment and Plan:  Brett Martenson  Wheeler in today with chief complaint of Insect Bite   1. Insect bite of left ankle, initial encounter Possible snake bite Soak in epsom salt daily Take all antibiotics If not  improving needs face ti face visit  Meds ordered this encounter  Medications   sulfamethoxazole-trimethoprim (BACTRIM DS) 800-160 MG tablet    Sig: Take 1 tablet by mouth 2 (two) times daily.    Dispense:  20 tablet    Refill:  0    Order Specific Question:   Supervising Provider    Answer:   Merrilee Jansky [id number]       Follow Up Instructions: I discussed the assessment and treatment plan with the  patient. The patient was provided an opportunity to ask questions and all were answered. The patient agreed with the plan and demonstrated an understanding of the instructions.  A copy of instructions were sent to the patient via MyChart.  The patient was advised to call back or seek an in-person evaluation if the symptoms worsen or if the condition fails to improve as anticipated.  Time:  I spent 6 minutes with the patient via telehealth technology discussing the above problems/concerns.    Mary-Margaret Daphine Deutscher, FNP

## 2023-03-15 ENCOUNTER — Encounter: Payer: Self-pay | Admitting: Family Medicine

## 2023-06-19 ENCOUNTER — Encounter: Payer: 59 | Admitting: Family Medicine

## 2023-06-27 ENCOUNTER — Encounter: Payer: Self-pay | Admitting: Family Medicine

## 2023-07-14 NOTE — Progress Notes (Unsigned)
[facility] [address] [place], [place] [address] [phone]  Date:  07/17/2023   Name:  Brett Wheeler   DOB:  12/15/1975   MRN:  [id number]  PCP:  Pearline Cables, MD    Chief Complaint: No chief complaint on file.   History of Present Illness:  Brett Wheeler is a 47 y.o. very pleasant male patient who presents with the following:  Patient seen today for physical Most recent visit with myself was a virtual visit in January, would last an in person visit in March 2023 History of hypertension and dyslipidemia  Taking pravastatin, losartan Blood work on chart from February-A1c 5.7  Flu vaccine Colon cancer screening  Patient Active Problem List   Diagnosis Date Noted   Benign essential hypertension 06/15/2017    Past Medical History:  Diagnosis Date   Hypertension     Past Surgical History:  Procedure Laterality Date   HERNIA REPAIR      Social History   Tobacco Use   Smoking status: Never   Smokeless tobacco: Never  Vaping Use   Vaping status: Never Used  Substance Use Topics   Alcohol use: No   Drug use: No    Family History  Problem Relation Age of Onset   Cancer Maternal Grandfather    Lymphoma Paternal Grandfather     No Known Allergies  Medication list has been reviewed and updated.  Current Outpatient Medications on File Prior to Visit  Medication Sig Dispense Refill   losartan (COZAAR) 100 MG tablet Take 1 tablet (100 mg total) by mouth daily. 90 tablet 3   omeprazole (PRILOSEC) 20 MG capsule Take 20 mg by mouth daily.     pravastatin (PRAVACHOL) 20 MG tablet Take 1 tablet (20 mg total) by mouth daily. 90 tablet 3   sulfamethoxazole-trimethoprim (BACTRIM DS) 800-160 MG tablet Take 1 tablet by mouth 2 (two) times daily. 20 tablet 0   No current facility-administered medications on file prior to visit.    Review of Systems:  As per HPI- otherwise negative.   Physical  Examination: There were no vitals filed for this visit. There were no vitals filed for this visit. There is no height or weight on file to calculate BMI. Ideal Body Weight:    GEN: no acute distress. HEENT: Atraumatic, Normocephalic.  Ears and Nose: No external deformity. CV: RRR, No M/G/R. No JVD. No thrill. No extra heart sounds. PULM: CTA B, no wheezes, crackles, rhonchi. No retractions. No resp. distress. No accessory muscle use. ABD: S, NT, ND, +BS. No rebound. No HSM. EXTR: No c/c/e PSYCH: Normally interactive. Conversant.    Assessment and Plan: *** Physical exam today.  Encouraged healthy diet and exercise routine Will plan further follow- up pending labs.  Signed Abbe Amsterdam, MD

## 2023-07-17 ENCOUNTER — Ambulatory Visit (INDEPENDENT_AMBULATORY_CARE_PROVIDER_SITE_OTHER): Payer: 59 | Admitting: Family Medicine

## 2023-07-17 VITALS — BP 155/90 | HR 97 | Temp 98.0°F | Resp 18 | Ht 76.0 in | Wt 222.6 lb

## 2023-07-17 DIAGNOSIS — I10 Essential (primary) hypertension: Secondary | ICD-10-CM

## 2023-07-17 DIAGNOSIS — Z130 Encounter for screening for diseases of the blood and blood-forming organs and certain disorders involving the immune mechanism: Secondary | ICD-10-CM | POA: Diagnosis not present

## 2023-07-17 DIAGNOSIS — Z131 Encounter for screening for diabetes mellitus: Secondary | ICD-10-CM | POA: Diagnosis not present

## 2023-07-17 DIAGNOSIS — E785 Hyperlipidemia, unspecified: Secondary | ICD-10-CM

## 2023-07-17 DIAGNOSIS — Z0000 Encounter for general adult medical examination without abnormal findings: Secondary | ICD-10-CM | POA: Diagnosis not present

## 2023-07-17 DIAGNOSIS — Z125 Encounter for screening for malignant neoplasm of prostate: Secondary | ICD-10-CM | POA: Diagnosis not present

## 2023-07-17 DIAGNOSIS — Z1211 Encounter for screening for malignant neoplasm of colon: Secondary | ICD-10-CM

## 2023-07-17 MED ORDER — LOSARTAN POTASSIUM 100 MG PO TABS: 100.0000 mg | ORAL_TABLET | Freq: Every day | ORAL | 3 refills | Status: DC

## 2023-07-17 MED ORDER — PRAVASTATIN SODIUM 20 MG PO TABS: 20.0000 mg | ORAL_TABLET | Freq: Every day | ORAL | 3 refills | Status: AC

## 2023-07-17 NOTE — Patient Instructions (Signed)
It was good to see you today, I will be in touch with your blood work as soon as possible  Please do be sure to get in touch with gastroenterology, let me know if you still do not have any luck.  I do want you to be sure and get a screening colonoscopy this year  Please check your blood pressure at home a few times over the next 10 to 14 days and send me a message with your readings.  If you are consistently higher than 140/85 we will want to make an adjustment to your medication  Recommend seasonal flu vaccine

## 2023-07-18 LAB — COMPREHENSIVE METABOLIC PANEL
ALT: 29 U/L (ref 0–53)
AST: 16 U/L (ref 0–37)
Albumin: 4.2 g/dL (ref 3.5–5.2)
Alkaline Phosphatase: 63 U/L (ref 39–117)
BUN: 13 mg/dL (ref 6–23)
CO2: 29 meq/L (ref 19–32)
Calcium: 9.1 mg/dL (ref 8.4–10.5)
Chloride: 105 meq/L (ref 96–112)
Creatinine, Ser: 1.12 mg/dL (ref 0.40–1.50)
GFR: 77.42 mL/min (ref >60.00)
Glucose, Bld: 105 mg/dL — ABNORMAL HIGH (ref 70–99)
Potassium: 4.1 meq/L (ref 3.5–5.1)
Sodium: 140 meq/L (ref 135–145)
Total Bilirubin: 0.5 mg/dL (ref 0.2–1.2)
Total Protein: 6.4 g/dL (ref 6.0–8.3)

## 2023-07-18 LAB — CBC
HCT: 42.9 % (ref 39.0–52.0)
Hemoglobin: 14.5 g/dL (ref 13.0–17.0)
MCHC: 33.7 g/dL (ref 30.0–36.0)
MCV: 90.1 fL (ref 78.0–100.0)
Platelets: 311 10*3/uL (ref 150.0–400.0)
RBC: 4.76 Mil/uL (ref 4.22–5.81)
RDW: 13.5 % (ref 11.5–15.5)
WBC: 7.8 10*3/uL (ref 4.0–10.5)

## 2023-07-18 LAB — LIPID PANEL
Cholesterol: 137 mg/dL (ref 0–200)
HDL: 26.1 mg/dL — ABNORMAL LOW (ref >39.00)
LDL Cholesterol: 47 mg/dL (ref 0–99)
NonHDL: 110.91
Total CHOL/HDL Ratio: 5
Triglycerides: 319 mg/dL — ABNORMAL HIGH (ref 0.0–149.0)
VLDL: 63.8 mg/dL — ABNORMAL HIGH (ref 0.0–40.0)

## 2023-07-18 LAB — PSA: PSA: 1.09 ng/mL (ref 0.10–4.00)

## 2023-07-18 LAB — HEMOGLOBIN A1C: Hgb A1c MFr Bld: 5.7 % (ref 4.6–6.5)

## 2023-07-19 ENCOUNTER — Encounter: Payer: Self-pay | Admitting: Family Medicine

## 2023-08-02 ENCOUNTER — Encounter: Payer: Self-pay | Admitting: Gastroenterology

## 2023-08-03 LAB — LIPID PANEL
Cholesterol: 137 (ref 0–200)
HDL: 31 — AB (ref 35–70)
LDL Cholesterol: 85
Triglycerides: 111 (ref 40–160)

## 2023-08-03 LAB — BASIC METABOLIC PANEL: Creatinine: 1.3 (ref 0.6–1.3)

## 2023-08-03 LAB — COMPREHENSIVE METABOLIC PANEL: eGFR: 69

## 2023-08-18 ENCOUNTER — Ambulatory Visit (HOSPITAL_BASED_OUTPATIENT_CLINIC_OR_DEPARTMENT_OTHER)
Admission: RE | Admit: 2023-08-18 | Discharge: 2023-08-18 | Disposition: A | Discharge status: Home / Self Care | Payer: BC Managed Care – PPO | Source: Ambulatory Visit | Attending: Internal Medicine

## 2023-08-18 ENCOUNTER — Telehealth (HOSPITAL_BASED_OUTPATIENT_CLINIC_OR_DEPARTMENT_OTHER): Payer: Self-pay | Admitting: Internal Medicine

## 2023-08-18 ENCOUNTER — Encounter (HOSPITAL_BASED_OUTPATIENT_CLINIC_OR_DEPARTMENT_OTHER): Payer: Self-pay

## 2023-08-18 VITALS — BP 144/92 | HR 99 | Temp 98.3°F | Resp 20

## 2023-08-18 DIAGNOSIS — J209 Acute bronchitis, unspecified: Secondary | ICD-10-CM

## 2023-08-18 DIAGNOSIS — R0602 Shortness of breath: Secondary | ICD-10-CM | POA: Diagnosis not present

## 2023-08-18 MED ORDER — BENZONATATE 100 MG PO CAPS: 100.0000 mg | ORAL_CAPSULE | Freq: Three times a day (TID) | ORAL | 0 refills | Status: DC

## 2023-08-18 MED ORDER — DEXAMETHASONE SODIUM PHOSPHATE 10 MG/ML IJ SOLN
10.0000 mg | Freq: Once | INTRAMUSCULAR | Status: AC
  Administered 2023-08-18: 10 mg via INTRAMUSCULAR

## 2023-08-18 MED ORDER — PREDNISONE 20 MG PO TABS: 40.0000 mg | ORAL_TABLET | Freq: Every day | ORAL | 0 refills | Status: AC

## 2023-08-18 MED ORDER — AEROCHAMBER PLUS FLO-VU MEDIUM MISC: 1.0000 | Freq: Once | 0 refills | Status: AC

## 2023-08-18 MED ORDER — ONDANSETRON 4 MG PO TBDP
4.0000 mg | ORAL_TABLET | Freq: Once | ORAL | Status: AC
Start: 2023-08-18 — End: 2023-08-18
  Administered 2023-08-18: 4 mg via ORAL

## 2023-08-18 MED ORDER — ALBUTEROL SULFATE (2.5 MG/3ML) 0.083% IN NEBU
2.5000 mg | INHALATION_SOLUTION | Freq: Once | RESPIRATORY_TRACT | Status: AC
  Administered 2023-08-18: 2.5 mg via RESPIRATORY_TRACT

## 2023-08-18 MED ORDER — ALBUTEROL SULFATE HFA 108 (90 BASE) MCG/ACT IN AERS: 1.0000 | INHALATION_SPRAY | Freq: Four times a day (QID) | RESPIRATORY_TRACT | 0 refills | Status: AC | PRN

## 2023-08-18 MED ORDER — PROMETHAZINE-DM 6.25-15 MG/5ML PO SYRP
5.0000 mL | ORAL_SOLUTION | Freq: Every evening | ORAL | 0 refills | Status: DC | PRN
Start: 2023-08-18 — End: 2023-08-31

## 2023-08-18 NOTE — Discharge Instructions (Signed)
You have bronchitis which is inflammation of the upper airways in your lungs due to a virus. The following medicines will help with your symptoms.   - Take steroid pills sent to pharmacy as directed. Do not take any other NSAID containing medications such as ibuprofen or naproxen/Aleve while taking prednisone. - You may use albuterol inhaler 1 to 2 puffs every 4-6 hours as needed for cough, shortness of breath, and wheezing. - Take cough medicines as prescribed.  - Continue using over the counter medicines as needed as directed. Plain mucinex (guaifenesin) over the counter may further help breakup mucus and help with symptoms.   If you develop any new or worsening symptoms or do not improve in the next 2 to 3 days, please return.  If your symptoms are severe, please go to the emergency room. Follow-up with PCP as needed.

## 2023-08-18 NOTE — ED Triage Notes (Signed)
 Cough, congestion onset 2 weeks ago. States had been improving but symptoms became worse last night.

## 2023-08-18 NOTE — ED Provider Notes (Signed)
 [facility]    CSN: [id number] Arrival date & time: 08/18/23  1114      History   Chief Complaint Chief Complaint  Patient presents with   Nasal Congestion    Coughing, congestion, headache - Entered by patient    HPI Brett Wheeler is a 48 y.o. male.   Brett Wheeler is a 48 y.o. male presenting for chief complaint of nasal congestion, cough, sore throat, and shortness of breath that started 2 weeks ago, got better, and worsened yesterday. Cough has become productive with associated shortness of breath and coughing fits. Reports nausea as a result of harsh cough without vomiting, diarrhea, abdominal pain, fever, chills, rash, dizziness, or palpitations. Family is sick with similar symptoms.  Never smoker, denies drug use.  Denies history of chronic respiratory problems.  History of hypertension, takes losartan .  He has been taking over-the-counter medications without relief.     Past Medical History:  Diagnosis Date   Hypertension     Patient Active Problem List   Diagnosis Date Noted   Benign essential hypertension 06/15/2017    Past Surgical History:  Procedure Laterality Date   HERNIA REPAIR         Home Medications    Prior to Admission medications   Medication Sig Start Date End Date Taking? Authorizing Provider  albuterol  (VENTOLIN  HFA) 108 (90 Base) MCG/ACT inhaler Inhale 1-2 puffs into the lungs every 6 (six) hours as needed for wheezing or shortness of breath. 08/18/23  Yes Enedelia Dorna HERO, FNP  benzonatate  (TESSALON ) 100 MG capsule Take 1 capsule (100 mg total) by mouth every 8 (eight) hours. 08/18/23  Yes Enedelia Dorna HERO, FNP  predniSONE  (DELTASONE ) 20 MG tablet Take 2 tablets (40 mg total) by mouth daily with breakfast for 5 days. 08/18/23 08/23/23 Yes StanhopeDorna HERO, FNP  promethazine -dextromethorphan (PROMETHAZINE -DM) 6.25-15 MG/5ML syrup Take 5 mLs by mouth at bedtime as needed for cough. 08/18/23  Yes Enedelia Dorna HERO, FNP   losartan  (COZAAR ) 100 MG tablet Take 1 tablet (100 mg total) by mouth daily. 07/17/23   Copland, Harlene BROCKS, MD  omeprazole (PRILOSEC) 20 MG capsule Take 20 mg by mouth daily.    [provider]  pravastatin  (PRAVACHOL ) 20 MG tablet Take 1 tablet (20 mg total) by mouth daily. 07/17/23   Copland, Harlene BROCKS, MD    Family History Family History  Problem Relation Age of Onset   Cancer Maternal Grandfather    Lymphoma Paternal Grandfather     Social History Social History   Tobacco Use   Smoking status: Never   Smokeless tobacco: Never  Vaping Use   Vaping status: Never Used  Substance Use Topics   Alcohol use: No   Drug use: No     Allergies   Patient has no known allergies.   Review of Systems Review of Systems Per HPI  Physical Exam Triage Vital Signs ED Triage Vitals [08/18/23 1229]  Encounter Vitals Group     BP (!) 144/92     Systolic BP Percentile      Diastolic BP Percentile      Pulse Rate 99     Resp 20     Temp 98.3 F (36.8 C)     Temp Source Oral     SpO2 97 %     Weight      Height      Head Circumference      Peak Flow      Pain Score  3     Pain Loc      Pain Education      Exclude from Growth Chart    No data found.  Updated Vital Signs BP (!) 144/92 (BP Location: Right Arm)   Pulse 99   Temp 98.3 F (36.8 C) (Oral)   Resp 20   SpO2 97%   Visual Acuity Right Eye Distance:   Left Eye Distance:   Bilateral Distance:    Right Eye Near:   Left Eye Near:    Bilateral Near:     Physical Exam Vitals and nursing note reviewed.  Constitutional:      Appearance: He is ill-appearing. He is not toxic-appearing.  HENT:     Head: Normocephalic and atraumatic.     Right Ear: Hearing, tympanic membrane, ear canal and external ear normal.     Left Ear: Hearing, tympanic membrane, ear canal and external ear normal.     Nose: Congestion present.     Mouth/Throat:     Lips: Pink.     Mouth: Mucous membranes are moist. No injury or  oral lesions.     Dentition: Normal dentition.     Tongue: No lesions.     Pharynx: Oropharynx is clear. Uvula midline. Posterior oropharyngeal erythema present. No pharyngeal swelling, oropharyngeal exudate, uvula swelling or postnasal drip.     Tonsils: No tonsillar exudate.  Eyes:     General: Lids are normal. Vision grossly intact. Gaze aligned appropriately.     Extraocular Movements: Extraocular movements intact.     Conjunctiva/sclera: Conjunctivae normal.  Neck:     Trachea: Trachea and phonation normal.  Cardiovascular:     Rate and Rhythm: Normal rate and regular rhythm.     Heart sounds: Normal heart sounds, S1 normal and S2 normal.  Pulmonary:     Effort: Pulmonary effort is normal. No respiratory distress.     Breath sounds: Normal air entry. Wheezing present. No rhonchi or rales.     Comments: Frequent harsh cough on deep inspiration and with coughing. Speaking in full sentences with mild difficulty due to shortness of breath on initial assessment.  Chest:     Chest wall: No tenderness.  Musculoskeletal:     Cervical back: Neck supple.  Lymphadenopathy:     Cervical: No cervical adenopathy.  Skin:    General: Skin is warm and dry.     Capillary Refill: Capillary refill takes less than 2 seconds.     Findings: No rash.  Neurological:     General: No focal deficit present.     Mental Status: He is alert and oriented to person, place, and time. Mental status is at baseline.     Cranial Nerves: No dysarthria or facial asymmetry.  Psychiatric:        Mood and Affect: Mood normal.        Speech: Speech normal.        Behavior: Behavior normal.        Thought Content: Thought content normal.        Judgment: Judgment normal.      UC Treatments / Results  Labs (all labs ordered are listed, but only abnormal results are displayed) Labs Reviewed - No data to display  EKG   Radiology No results found.  Procedures Procedures (including critical care  time)  Medications Ordered in UC Medications  dexamethasone  (DECADRON ) injection 10 mg (has no administration in time range)  albuterol  (PROVENTIL ) (2.5 MG/3ML) 0.083% nebulizer solution 2.5 mg (2.5  mg Nebulization Given 08/18/23 1316)  ondansetron  (ZOFRAN -ODT) disintegrating tablet 4 mg (4 mg Oral Given 08/18/23 1316)    Initial Impression / Assessment and Plan / UC Course  I have reviewed the triage vital signs and the nursing notes.  Pertinent labs & imaging results that were available during my care of the patient were reviewed by me and considered in my medical decision making (see chart for details).   1. Acute bronchitis, shortness of breath Presentation consistent with acute viral bronchitis. Will treat with steroid, bronchodilator, cough suppressants for symptomatic relief, and expectorants (mucinex) as needed- see AVS.   Interventions in clinic: albuterol  breathing treatment administered with improvement in lung sounds on reassessment. Dexamethasone  10mg  IM given in clinic, prednisone  burst starting tomorrow.  Patient non-toxic in appearance, vital signs hemodynamically stable, no new oxygen requirement, therefore deferred imaging of the chest. Low suspicion for acute cardiopulmonary abnormality based on history and PE.  Strep/Viral testing: deferred based on timing of illness.  Counseled patient on potential for adverse effects with medications prescribed/recommended today, strict ER and return-to-clinic precautions discussed, patient verbalized understanding.    Final Clinical Impressions(s) / UC Diagnoses   Final diagnoses:  Acute bronchitis, unspecified organism  Shortness of breath     Discharge Instructions      You have bronchitis which is inflammation of the upper airways in your lungs due to a virus. The following medicines will help with your symptoms.   - Take steroid pills sent to pharmacy as directed. Do not take any other NSAID containing medications such  as ibuprofen or naproxen/Aleve while taking prednisone . - You may use albuterol  inhaler 1 to 2 puffs every 4-6 hours as needed for cough, shortness of breath, and wheezing. - Take cough medicines as prescribed.  - Continue using over the counter medicines as needed as directed. Plain mucinex (guaifenesin) over the counter may further help breakup mucus and help with symptoms.   If you develop any new or worsening symptoms or do not improve in the next 2 to 3 days, please return.  If your symptoms are severe, please go to the emergency room. Follow-up with PCP as needed.     ED Prescriptions     Medication Sig Dispense Auth. Provider   predniSONE  (DELTASONE ) 20 MG tablet Take 2 tablets (40 mg total) by mouth daily with breakfast for 5 days. 10 tablet Enedelia Dorna HERO, FNP   benzonatate  (TESSALON ) 100 MG capsule Take 1 capsule (100 mg total) by mouth every 8 (eight) hours. 21 capsule Brett Preece M, FNP   promethazine -dextromethorphan (PROMETHAZINE -DM) 6.25-15 MG/5ML syrup Take 5 mLs by mouth at bedtime as needed for cough. 118 mL Enedelia Dorna M, FNP   albuterol  (VENTOLIN  HFA) 108 (90 Base) MCG/ACT inhaler Inhale 1-2 puffs into the lungs every 6 (six) hours as needed for wheezing or shortness of breath. 8 g Enedelia Dorna HERO, FNP      PDMP not reviewed this encounter.   Enedelia Dorna HERO, [place] 08/18/23 1336

## 2023-08-18 NOTE — Telephone Encounter (Signed)
 Spacer ordered, forgot to order spacer during initial visit for albuterol inhaler.

## 2023-08-22 ENCOUNTER — Telehealth: Payer: BC Managed Care – PPO | Admitting: Physician Assistant

## 2023-08-22 DIAGNOSIS — J208 Acute bronchitis due to other specified organisms: Secondary | ICD-10-CM | POA: Diagnosis not present

## 2023-08-22 DIAGNOSIS — B9689 Other specified bacterial agents as the cause of diseases classified elsewhere: Secondary | ICD-10-CM

## 2023-08-22 MED ORDER — AZITHROMYCIN 250 MG PO TABS: ORAL_TABLET | ORAL | 0 refills | Status: AC

## 2023-08-22 NOTE — Progress Notes (Signed)
 E-Visit for Cough   We are sorry that you are not feeling well.  Here is how we plan to help!  Based on your presentation I believe you most likely have A cough due to bacteria.  When patients have a fever and a productive cough with a change in color or increased sputum production, we are concerned about bacterial bronchitis.  If left untreated it can progress to pneumonia.  If your symptoms do not improve with your treatment plan it is important that you contact your provider.   I have prescribed Azithromyin 250 mg: two tablets now and then one tablet daily for 4 additonal days    In addition you may use A non-prescription cough medication called Mucinex DM: take 2 tablets every 12 hours.  From your responses in the eVisit questionnaire you describe inflammation in the upper respiratory tract which is causing a significant cough.  This is commonly called Bronchitis and has four common causes:   Allergies Viral Infections Acid Reflux Bacterial Infection Allergies, viruses and acid reflux are treated by controlling symptoms or eliminating the cause. An example might be a cough caused by taking certain blood pressure medications. You stop the cough by changing the medication. Another example might be a cough caused by acid reflux. Controlling the reflux helps control the cough.  USE OF BRONCHODILATOR (RESCUE) INHALERS: There is a risk from using your bronchodilator too frequently.  The risk is that over-reliance on a medication which only relaxes the muscles surrounding the breathing tubes can reduce the effectiveness of medications prescribed to reduce swelling and congestion of the tubes themselves.  Although you feel brief relief from the bronchodilator inhaler, your asthma may actually be worsening with the tubes becoming more swollen and filled with mucus.  This can delay other crucial treatments, such as oral steroid medications. If you need to use a bronchodilator inhaler daily, several  times per day, you should discuss this with your provider.  There are probably better treatments that could be used to keep your asthma under control.     HOME CARE Only take medications as instructed by your medical team. Complete the entire course of an antibiotic. Drink plenty of fluids and get plenty of rest. Avoid close contacts especially the very young and the elderly Cover your mouth if you cough or cough into your sleeve. Always remember to wash your hands A steam or ultrasonic humidifier can help congestion.   GET HELP RIGHT AWAY IF: You develop worsening fever. You become short of breath Any worsening symptoms You cough up blood. Your symptoms persist after you have completed your treatment plan MAKE SURE YOU  Understand these instructions. Will watch your condition. Will get help right away if you are not doing well or get worse.    Thank you for choosing an e-visit.  Your e-visit answers were reviewed by a board certified advanced clinical practitioner to complete your personal care plan. Depending upon the condition, your plan could have included both over the counter or prescription medications.  Please review your pharmacy choice. Make sure the pharmacy is open so you can pick up prescription now. If there is a problem, you may contact your provider through [employer] and have the prescription routed to another pharmacy.  Your safety is important to us . If you have drug allergies check your prescription carefully.   For the next 24 hours you can use MyChart to ask questions about today's visit, request a non-urgent call back, or ask for  a work or school excuse. You will get an email in the next two days asking about your experience. I hope that your e-visit has been valuable and will speed your recovery.   I have spent 5 minutes in review of e-visit questionnaire, review and updating patient chart, medical decision making and response to patient.   Delon CHRISTELLA Dickinson, PA-C

## 2023-08-25 LAB — HEMOGLOBIN A1C
EGFR: 69
Hemoglobin A1C: 5.7

## 2023-08-28 ENCOUNTER — Encounter: Payer: Self-pay | Admitting: Family Medicine

## 2023-08-31 ENCOUNTER — Ambulatory Visit (AMBULATORY_SURGERY_CENTER): Payer: BC Managed Care – PPO

## 2023-08-31 VITALS — Ht 76.0 in | Wt 210.0 lb

## 2023-08-31 DIAGNOSIS — Z1211 Encounter for screening for malignant neoplasm of colon: Secondary | ICD-10-CM

## 2023-08-31 MED ORDER — NA SULFATE-K SULFATE-MG SULF 17.5-3.13-1.6 GM/177ML PO SOLN: 1.0000 | Freq: Once | ORAL | 0 refills | Status: AC

## 2023-08-31 NOTE — Progress Notes (Signed)
 No egg or soy allergy known to patient  No issues known to pt with past sedation with any surgeries or procedures Patient denies ever being told they had issues or difficulty with intubation  No FH of Malignant Hyperthermia Pt is not on diet pills Pt is not on  home 02  Pt is not on blood thinners  Pt denies issues with constipation  No A fib or A flutter Have any cardiac testing pending--no  LOA: independent  Prep: suprep   Patient's chart reviewed by Cathlyn Parsons CNRA prior to previsit and patient appropriate for the [facility].  Previsit completed and red dot placed by patient's name on their procedure day (on provider's schedule).     PV competed with patient. Prep instructions sent via mychart

## 2023-09-04 ENCOUNTER — Encounter: Payer: Self-pay | Admitting: Gastroenterology

## 2023-09-05 ENCOUNTER — Ambulatory Visit: Payer: 59 | Admitting: Gastroenterology

## 2023-09-05 ENCOUNTER — Encounter: Payer: Self-pay | Admitting: Gastroenterology

## 2023-09-05 VITALS — BP 129/91 | HR 72 | Temp 97.2°F | Resp 16 | Ht 76.0 in | Wt 210.0 lb

## 2023-09-05 DIAGNOSIS — Z83719 Family history of colon polyps, unspecified: Secondary | ICD-10-CM

## 2023-09-05 DIAGNOSIS — K648 Other hemorrhoids: Secondary | ICD-10-CM

## 2023-09-05 DIAGNOSIS — Z1211 Encounter for screening for malignant neoplasm of colon: Secondary | ICD-10-CM | POA: Diagnosis present

## 2023-09-05 MED ORDER — SODIUM CHLORIDE 0.9 % IV SOLN: 500.0000 mL | Freq: Once | INTRAVENOUS | Status: DC

## 2023-09-05 NOTE — Patient Instructions (Signed)
Please read handouts provided. Continue present medications. Resume previous diet. Repeat colonoscopy in 10 years for screening.  YOU HAD AN ENDOSCOPIC PROCEDURE TODAY AT THE [facility]:   Refer to the procedure report that was given to you for any specific questions about what was found during the examination.  If the procedure report does not answer your questions, please call your gastroenterologist to clarify.  If you requested that your care partner not be given the details of your procedure findings, then the procedure report has been included in a sealed envelope for you to review at your convenience later.  YOU SHOULD EXPECT: Some feelings of bloating in the abdomen. Passage of more gas than usual.  Walking can help get rid of the air that was put into your GI tract during the procedure and reduce the bloating. If you had a lower endoscopy (such as a colonoscopy or flexible sigmoidoscopy) you may notice spotting of blood in your stool or on the toilet paper. If you underwent a bowel prep for your procedure, you may not have a normal bowel movement for a few days.  Please Note:  You might notice some irritation and congestion in your nose or some drainage.  This is from the oxygen used during your procedure.  There is no need for concern and it should clear up in a day or so.  SYMPTOMS TO REPORT IMMEDIATELY:  Following lower endoscopy (colonoscopy or flexible sigmoidoscopy):  Excessive amounts of blood in the stool  Significant tenderness or worsening of abdominal pains  Swelling of the abdomen that is new, acute  Fever of 100F or higher.  For urgent or emergent issues, a gastroenterologist can be reached at any hour by calling [phone]. Do not use MyChart messaging for urgent concerns.    DIET:  We do recommend a small meal at first, but then you may proceed to your regular diet.  Drink plenty of fluids but you should avoid alcoholic beverages for 24  hours.  ACTIVITY:  You should plan to take it easy for the rest of today and you should NOT DRIVE or use heavy machinery until tomorrow (because of the sedation medicines used during the test).    FOLLOW UP: Our staff will call the number listed on your records the next business day following your procedure.  We will call around 7:15- 8:00 am to check on you and address any questions or concerns that you may have regarding the information given to you following your procedure. If we do not reach you, we will leave a message.     If any biopsies were taken you will be contacted by phone or by letter within the next 1-3 weeks.  Please call us at [phone] if you have not heard about the biopsies in 3 weeks.    SIGNATURES/CONFIDENTIALITY: You and/or your care partner have signed paperwork which will be entered into your electronic medical record.  These signatures attest to the fact that that the information above on your After Visit Summary has been reviewed and is understood.  Full responsibility of the confidentiality of this discharge information lies with you and/or your care-partner.

## 2023-09-05 NOTE — Progress Notes (Signed)
 Pt A/O x 3, gd SR's, pleased with anesthesia, report to RN

## 2023-09-05 NOTE — Op Note (Signed)
[facility] Patient Name: Brett Wheeler Procedure Date: 09/05/2023 9:28 AM MRN: [id number] Endoscopist: Viviann Spare P. Adela Lank , MD, [id number] Age: 48 Referring MD:  Date of Birth: 02-26-1976 Gender: Male Account #: [id number] Procedure:                Colonoscopy Indications:              Screening for colorectal malignant neoplasm, This                            is the patient's first colonoscopy Medicines:                Monitored Anesthesia Care Procedure:                Pre-Anesthesia Assessment:                           - Prior to the procedure, a History and Physical                            was performed, and patient medications and                            allergies were reviewed. The patient's tolerance of                            previous anesthesia was also reviewed. The risks                            and benefits of the procedure and the sedation                            options and risks were discussed with the patient.                            All questions were answered, and informed consent                            was obtained. Prior Anticoagulants: The patient has                            taken no anticoagulant or antiplatelet agents. ASA                            Grade Assessment: II - A patient with mild systemic                            disease. After reviewing the risks and benefits,                            the patient was deemed in satisfactory condition to                            undergo the procedure.  After obtaining informed consent, the colonoscope                            was passed under direct vision. Throughout the                            procedure, the patient's blood pressure, pulse, and                            oxygen saturations were monitored continuously. The                            Olympus Scope SN [id number] was introduced through the                            anus and advanced  to the the cecum, identified by                            appendiceal orifice and ileocecal valve. The                            colonoscopy was performed without difficulty. The                            patient tolerated the procedure well. The quality                            of the bowel preparation was good. The ileocecal                            valve, appendiceal orifice, and rectum were                            photographed. Scope In: 9:38:15 AM Scope Out: 9:54:15 AM Scope Withdrawal Time: 0 hours 11 minutes 55 seconds  Total Procedure Duration: 0 hours 16 minutes 0 seconds  Findings:                 The perianal and digital rectal examinations were                            normal.                           Internal hemorrhoids were found during retroflexion.                           The exam was otherwise without abnormality. Complications:            No immediate complications. Estimated blood loss:                            None. Estimated Blood Loss:     Estimated blood loss: none. Impression:               - Internal hemorrhoids.                           -  The examination was otherwise normal.                           - No polyps. Recommendation:           - Patient has a contact number available for                            emergencies. The signs and symptoms of potential                            delayed complications were discussed with the                            patient. Return to normal activities tomorrow.                            Written discharge instructions were provided to the                            patient.                           - Resume previous diet.                           - Continue present medications.                           - Repeat colonoscopy in 10 years for screening                            purposes. Viviann Spare P. Dominic Mahaney, MD 09/05/2023 9:58:30 AM This report has been signed electronically.

## 2023-09-05 NOTE — Progress Notes (Signed)
[facility] History and Physical   Primary Care Physician:  Copland, Gwenlyn Found, MD   Reason for Procedure:   Colon cancer screening  Plan:    colonoscopy     HPI: Brett Wheeler is a 48 y.o. male  here for colonoscopy screening - first time exam.   Patient denies any bowel symptoms at this time. NO family history of colon cancer known- father had polyps. Otherwise feels well without any cardiopulmonary symptoms.   I have discussed risks / benefits of anesthesia and endoscopic procedure with Jarold Motto and they wish to proceed with the exams as outlined today.    Past Medical History:  Diagnosis Date   Hypertension     Past Surgical History:  Procedure Laterality Date   HERNIA REPAIR      Prior to Admission medications   Medication Sig Start Date End Date Taking? Authorizing Provider  losartan (COZAAR) 100 MG tablet Take 1 tablet (100 mg total) by mouth daily. 07/17/23  Yes Copland, Gwenlyn Found, MD  omeprazole (PRILOSEC) 20 MG capsule Take 20 mg by mouth daily.   Yes [provider]  pravastatin (PRAVACHOL) 20 MG tablet Take 1 tablet (20 mg total) by mouth daily. 07/17/23  Yes Copland, Gwenlyn Found, MD  albuterol (VENTOLIN HFA) 108 (90 Base) MCG/ACT inhaler Inhale 1-2 puffs into the lungs every 6 (six) hours as needed for wheezing or shortness of breath. 08/18/23   Carlisle Beers, FNP    Current Outpatient Medications  Medication Sig Dispense Refill   losartan (COZAAR) 100 MG tablet Take 1 tablet (100 mg total) by mouth daily. 90 tablet 3   omeprazole (PRILOSEC) 20 MG capsule Take 20 mg by mouth daily.     pravastatin (PRAVACHOL) 20 MG tablet Take 1 tablet (20 mg total) by mouth daily. 90 tablet 3   albuterol (VENTOLIN HFA) 108 (90 Base) MCG/ACT inhaler Inhale 1-2 puffs into the lungs every 6 (six) hours as needed for wheezing or shortness of breath. 8 g 0   Current Facility-Administered Medications  Medication Dose Route Frequency Provider Last Rate  Last Admin   0.9 %  sodium chloride infusion  500 mL Intravenous Once Haruko Mersch, Willaim Rayas, MD        Allergies as of 09/05/2023   (No Known Allergies)    Family History  Problem Relation Age of Onset   Colon cancer Father    Cancer Maternal Grandfather    Lymphoma Paternal Grandfather    Rectal cancer Neg Hx    Stomach cancer Neg Hx    Esophageal cancer Neg Hx     Social History   Socioeconomic History   Marital status: Married    Spouse name: Not on file   Number of children: Not on file   Years of education: Not on file   Highest education level: Not on file  Occupational History   Not on file  Tobacco Use   Smoking status: Never   Smokeless tobacco: Never  Vaping Use   Vaping status: Never Used  Substance and Sexual Activity   Alcohol use: No   Drug use: No   Sexual activity: Not on file  Other Topics Concern   Not on file  Social History Narrative   Not on file   Social Drivers of Health   Financial Resource Strain: Not on file  Food Insecurity: Not on file  Transportation Needs: Not on file  Physical Activity: Not on file  Stress: Not on file  Social  Connections: Not on file  Intimate Partner Violence: Not on file    Review of Systems: All other review of systems negative except as mentioned in the HPI.  Physical Exam: Vital signs BP (!) 153/103 (BP Location: Left Arm, Patient Position: Sitting, Cuff Size: Normal)   Pulse 81   Temp (!) 97.2 F (36.2 C)   Ht 6\' 4"  (1.93 m)   Wt 210 lb (95.3 kg)   SpO2 100%   BMI 25.56 kg/m   General:   Alert,  Well-developed, pleasant and cooperative in NAD Lungs:  Clear throughout to auscultation.   Heart:  Regular rate and rhythm Abdomen:  Soft, nontender and nondistended.   Neuro/Psych:  Alert and cooperative. Normal mood and affect. A and O x 3  Harlin Rain, MD [facility]

## 2023-09-05 NOTE — Progress Notes (Signed)
Pt's states no medical or surgical changes since previsit or office visit. 

## 2023-09-06 ENCOUNTER — Telehealth: Payer: Self-pay

## 2023-09-06 NOTE — Telephone Encounter (Signed)
  Follow up Call-     09/05/2023    8:42 AM  Call back number  Post procedure Call Back phone  # [phone]  Permission to leave phone message Yes     Patient questions:  Do you have a fever, pain , or abdominal swelling? No. Pain Score  0 *  Have you tolerated food without any problems? Yes.    Have you been able to return to your normal activities? Yes.    Do you have any questions about your discharge instructions: Diet   No. Medications  No. Follow up visit  No.  Do you have questions or concerns about your Care? No.  Actions: * If pain score is 4 or above: No action needed, pain <4.

## 2023-10-09 ENCOUNTER — Ambulatory Visit: Payer: BC Managed Care – PPO | Admitting: Family Medicine

## 2023-10-09 ENCOUNTER — Ambulatory Visit (HOSPITAL_BASED_OUTPATIENT_CLINIC_OR_DEPARTMENT_OTHER)
Admission: RE | Admit: 2023-10-09 | Discharge: 2023-10-09 | Disposition: A | Discharge status: Home / Self Care | Payer: BC Managed Care – PPO | Source: Ambulatory Visit | Attending: Family Medicine | Admitting: Family Medicine

## 2023-10-09 VITALS — BP 140/84 | HR 102 | Temp 97.4°F | Resp 18 | Ht 76.0 in | Wt 209.8 lb

## 2023-10-09 DIAGNOSIS — J40 Bronchitis, not specified as acute or chronic: Secondary | ICD-10-CM | POA: Diagnosis not present

## 2023-10-09 DIAGNOSIS — R059 Cough, unspecified: Secondary | ICD-10-CM | POA: Diagnosis not present

## 2023-10-09 DIAGNOSIS — J0140 Acute pansinusitis, unspecified: Secondary | ICD-10-CM | POA: Diagnosis not present

## 2023-10-09 DIAGNOSIS — R6889 Other general symptoms and signs: Secondary | ICD-10-CM

## 2023-10-09 LAB — POC INFLUENZA A&B (BINAX/QUICKVUE)
Influenza A, POC: NEGATIVE
Influenza B, POC: NEGATIVE

## 2023-10-09 LAB — POC COVID19 BINAXNOW: SARS Coronavirus 2 Ag: NEGATIVE

## 2023-10-09 MED ORDER — FLUTICASONE PROPIONATE 50 MCG/ACT NA SUSP: 2.0000 | Freq: Every day | NASAL | 6 refills | Status: AC

## 2023-10-09 MED ORDER — AMOXICILLIN-POT CLAVULANATE 875-125 MG PO TABS: 1.0000 | ORAL_TABLET | Freq: Two times a day (BID) | ORAL | 0 refills | Status: AC

## 2023-10-09 MED ORDER — PREDNISONE 10 MG PO TABS: ORAL_TABLET | ORAL | 0 refills | Status: AC

## 2023-10-09 NOTE — Progress Notes (Signed)
 Established Patient Office Visit  Subjective   Patient ID: Brett Wheeler, male    DOB: 1976/03/19  Age: 48 y.o. MRN: [id number]  Chief Complaint  Patient presents with   Cough    X1 week, productive cough, headache, fatigue, no COVID test.     HPI Discussed the use of AI scribe software for clinical note transcription with the patient, who gave verbal consent to proceed.  History of Present Illness   Brett Wheeler is a 48 year old male who presents with a persistent cough and chest discomfort.  He has been experiencing a persistent cough for the past week, which has not improved. He denies fever but notes congestion in the head and nose. He describes coughing up a small amount of sputum and experiencing chest pain. Occasional wheezing and a bad sinus headache are also present.  He has a history of a similar episode for which he visited the emergency room in January, where he was prescribed an inhaler, Tessalon, and prednisone. The cough never completely resolved since that visit and has worsened over the past week. He has been using an inhaler from the previous visit approximately three times a day to aid breathing. He has also taken Mucinex and Nyquil to manage symptoms, noting some improvement today compared to previous days.  No sore throat, and the cough does not keep him awake at night. He is not aware of any allergies.      Patient Active Problem List   Diagnosis Date Noted   Benign essential hypertension 06/15/2017   Past Medical History:  Diagnosis Date   Hypertension    Past Surgical History:  Procedure Laterality Date   HERNIA REPAIR     Social History   Tobacco Use   Smoking status: Never   Smokeless tobacco: Never  Vaping Use   Vaping status: Never Used  Substance Use Topics   Alcohol use: No   Drug use: No   Social History   Socioeconomic History   Marital status: Married    Spouse name: Not on file   Number of children: Not on file   Years of  education: Not on file   Highest education level: Associate degree: occupational, Scientist, product/process development, or vocational program  Occupational History   Not on file  Tobacco Use   Smoking status: Never   Smokeless tobacco: Never  Vaping Use   Vaping status: Never Used  Substance and Sexual Activity   Alcohol use: No   Drug use: No   Sexual activity: Not on file  Other Topics Concern   Not on file  Social History Narrative   Not on file   Social Drivers of Health   Financial Resource Strain: Low Risk  (10/09/2023)   Overall Financial Resource Strain (CARDIA)    Difficulty of Paying Living Expenses: Not hard at all  Food Insecurity: No Food Insecurity (10/09/2023)   Hunger Vital Sign    Worried About Running Out of Food in the Last Year: Never true    Ran Out of Food in the Last Year: Never true  Transportation Needs: No Transportation Needs (10/09/2023)   PRAPARE - Administrator, Civil Service (Medical): No    Lack of Transportation (Non-Medical): No  Physical Activity: Unknown (10/09/2023)   Exercise Vital Sign    Days of Exercise per Week: 0 days    Minutes of Exercise per Session: Not on file  Stress: No Stress Concern Present (10/09/2023)   [employer] of  Occupational Health - Occupational Stress Questionnaire    Feeling of Stress : Not at all  Social Connections: Moderately Integrated (10/09/2023)   Social Connection and Isolation Panel [NHANES]    Frequency of Communication with Friends and Family: More than three times a week    Frequency of Social Gatherings with Friends and Family: Once a week    Attends Religious Services: More than 4 times per year    Active Member of [employer] or Organizations: No    Attends Engineer, structural: Not on file    Marital Status: Married  Catering manager Violence: Not on file   Family Status  Relation Name Status   Father  (Not Specified)   MGF  (Not Specified)   PGF  (Not Specified)   Neg Hx  (Not Specified)  No  partnership data on file   Family History  Problem Relation Age of Onset   Colon cancer Father    Cancer Maternal Grandfather    Lymphoma Paternal Grandfather    Rectal cancer Neg Hx    Stomach cancer Neg Hx    Esophageal cancer Neg Hx    No Known Allergies    Review of Systems  Constitutional:  Positive for chills, diaphoresis and malaise/fatigue. Negative for fever.  HENT:  Negative for congestion.   Eyes:  Negative for blurred vision.  Respiratory:  Negative for cough and shortness of breath.   Cardiovascular:  Negative for chest pain, palpitations and leg swelling.  Gastrointestinal:  Negative for vomiting.  Musculoskeletal:  Negative for back pain.  Skin:  Negative for rash.  Neurological:  Negative for loss of consciousness and headaches.      Objective:     BP (!) 140/84 (BP Location: Left Arm, Patient Position: Sitting, Cuff Size: Normal)   Pulse (!) 102   Temp (!) 97.4 F (36.3 C) (Oral)   Resp 18   Ht 6\' 4"  (1.93 m)   Wt 209 lb 12.8 oz (95.2 kg)   SpO2 98%   BMI 25.54 kg/m  BP Readings from Last 3 Encounters:  10/09/23 (!) 140/84  09/05/23 (!) 129/91  08/18/23 (!) 144/92   Wt Readings from Last 3 Encounters:  10/09/23 209 lb 12.8 oz (95.2 kg)  09/05/23 210 lb (95.3 kg)  08/31/23 210 lb (95.3 kg)   SpO2 Readings from Last 3 Encounters:  10/09/23 98%  09/05/23 100%  08/18/23 97%      Physical Exam Vitals and nursing note reviewed.  Constitutional:      General: He is not in acute distress.    Appearance: Normal appearance. He is well-developed.  HENT:     Head: Normocephalic and atraumatic.     Right Ear: Tympanic membrane, ear canal and external ear normal. There is no impacted cerumen.     Left Ear: Tympanic membrane, ear canal and external ear normal. There is no impacted cerumen.     Nose:     Right Sinus: Maxillary sinus tenderness and frontal sinus tenderness present.     Left Sinus: Maxillary sinus tenderness and frontal sinus  tenderness present.     Mouth/Throat:     Mouth: Mucous membranes are moist.     Pharynx: Oropharynx is clear. No oropharyngeal exudate or posterior oropharyngeal erythema.  Eyes:     General: No scleral icterus.       Right eye: No discharge.        Left eye: No discharge.     Conjunctiva/sclera: Conjunctivae normal.  Pupils: Pupils are equal, round, and reactive to light.  Neck:     Thyroid: No thyromegaly.     Vascular: No JVD.  Cardiovascular:     Rate and Rhythm: Normal rate and regular rhythm.     Heart sounds: Normal heart sounds. No murmur heard. Pulmonary:     Effort: Pulmonary effort is normal. No respiratory distress.     Breath sounds: Wheezing present.  Abdominal:     General: Bowel sounds are normal. There is no distension.     Palpations: Abdomen is soft. There is no mass.     Tenderness: There is no abdominal tenderness. There is no guarding or rebound.  Musculoskeletal:        General: Normal range of motion.     Cervical back: Normal range of motion and neck supple.     Right lower leg: No edema.     Left lower leg: No edema.  Lymphadenopathy:     Cervical: No cervical adenopathy.  Skin:    General: Skin is warm and dry.     Findings: No erythema or rash.  Neurological:     Mental Status: He is alert and oriented to person, place, and time.     Cranial Nerves: No cranial nerve deficit.     Motor: No abnormal muscle tone.     Deep Tendon Reflexes: Reflexes are normal and symmetric. Reflexes normal.  Psychiatric:        Mood and Affect: Mood normal.        Behavior: Behavior normal.        Thought Content: Thought content normal.        Judgment: Judgment normal.      Results for orders placed or performed in visit on 10/09/23  POC COVID-19  Result Value Ref Range   SARS Coronavirus 2 Ag Negative Negative  POC Influenza A&B (Binax test)  Result Value Ref Range   Influenza A, POC Negative Negative   Influenza B, POC Negative Negative    Last  CBC Lab Results  Component Value Date   WBC 7.8 07/17/2023   HGB 14.5 07/17/2023   HCT 42.9 07/17/2023   MCV 90.1 07/17/2023   RDW 13.5 07/17/2023   PLT 311.0 07/17/2023   Last metabolic panel Lab Results  Component Value Date   GLUCOSE 105 (H) 07/17/2023   NA 140 07/17/2023   K 4.1 07/17/2023   CL 105 07/17/2023   CO2 29 07/17/2023   BUN 13 07/17/2023   CREATININE 1.3 08/03/2023   EGFR 69.0 08/25/2023   CALCIUM 9.1 07/17/2023   PROT 6.4 07/17/2023   ALBUMIN 4.2 07/17/2023   BILITOT 0.5 07/17/2023   ALKPHOS 63 07/17/2023   AST 16 07/17/2023   ALT 29 07/17/2023   Last lipids Lab Results  Component Value Date   CHOL 137 08/03/2023   HDL 31 (A) 08/03/2023   LDLCALC 85 08/03/2023   LDLDIRECT 142.0 11/01/2021   TRIG 111 08/03/2023   CHOLHDL 5 07/17/2023   Last hemoglobin A1c Lab Results  Component Value Date   HGBA1C 5.7 08/25/2023   Last thyroid functions No results found for: "TSH", "T3TOTAL", "T4TOTAL", "THYROIDAB" Last vitamin D No results found for: "25OHVITD2", "25OHVITD3", "VD25OH" Last vitamin B12 and Folate No results found for: "VITAMINB12", "FOLATE"    The 10-year ASCVD risk score (Arnett DK, et al., 2019) is: 4.1%* (Cholesterol units were assumed)    Assessment & Plan:   Problem List Items Addressed This Visit   None Visit  Diagnoses       Bronchitis    -  Primary   Relevant Medications   amoxicillin-clavulanate (AUGMENTIN) 875-125 MG tablet   predniSONE (DELTASONE) 10 MG tablet   Other Relevant Orders   DG Chest 2 View     Flu-like symptoms       Relevant Orders   POC COVID-19 (Completed)   POC Influenza A&B (Binax test) (Completed)     Acute non-recurrent pansinusitis       Relevant Medications   amoxicillin-clavulanate (AUGMENTIN) 875-125 MG tablet   predniSONE (DELTASONE) 10 MG tablet   fluticasone (FLONASE) 50 MCG/ACT nasal spray     Assessment and Plan    Chronic Cough with Wheezing A chronic cough with wheezing and chest  pain has persisted for over a week. There is no history of asthma. Previous treatments included an inhaler, Tessalon, and prednisone, but symptoms have worsened. The differential diagnosis includes an upper respiratory infection, bronchitis, or an unresolved lower respiratory tract infection. Wheezing was noted on examination. A prednisone taper was discussed to address inflammation and wheezing, with antibiotics considered if symptoms persist. Potential side effects of prednisone, such as increased appetite, mood changes, and elevated blood sugar, were explained. Flonase was discussed for sinus pressure, and the importance of a chest x-ray to rule out serious conditions was emphasized. Plan: order a chest x-ray, prescribe a prednisone taper, prescribe antibiotics, and recommend continued use of Mucinex and Nyquil. Flonase is recommended for sinus pressure.  Sinus Headache A sinus headache is reported, localized to the sinus area. There is no current use of Flonase, Nasacort, or Rhinocort. The benefits of Flonase for sinus pressure and symptom improvement were discussed. Flonase is recommended for sinus pressure.  Follow-up A follow-up appointment is scheduled to review chest x-ray results and assess symptom improvement.        Return if symptoms worsen or fail to improve.    Donato Schultz, DO

## 2023-10-09 NOTE — Patient Instructions (Signed)

## 2023-10-10 ENCOUNTER — Ambulatory Visit: Payer: BC Managed Care – PPO | Admitting: Family

## 2023-10-20 ENCOUNTER — Encounter: Payer: Self-pay | Admitting: Family Medicine

## 2024-01-13 ENCOUNTER — Emergency Department (HOSPITAL_BASED_OUTPATIENT_CLINIC_OR_DEPARTMENT_OTHER)
Admission: EM | Admit: 2024-01-13 | Discharge: 2024-01-14 | Disposition: A | Discharge status: Home / Self Care | Attending: Emergency Medicine | Admitting: Emergency Medicine

## 2024-01-13 ENCOUNTER — Emergency Department (HOSPITAL_BASED_OUTPATIENT_CLINIC_OR_DEPARTMENT_OTHER)

## 2024-01-13 ENCOUNTER — Other Ambulatory Visit: Payer: Self-pay

## 2024-01-13 ENCOUNTER — Encounter (HOSPITAL_BASED_OUTPATIENT_CLINIC_OR_DEPARTMENT_OTHER): Payer: Self-pay | Admitting: Emergency Medicine

## 2024-01-13 DIAGNOSIS — R112 Nausea with vomiting, unspecified: Secondary | ICD-10-CM | POA: Diagnosis not present

## 2024-01-13 DIAGNOSIS — K760 Fatty (change of) liver, not elsewhere classified: Secondary | ICD-10-CM | POA: Diagnosis not present

## 2024-01-13 DIAGNOSIS — K529 Noninfective gastroenteritis and colitis, unspecified: Secondary | ICD-10-CM | POA: Insufficient documentation

## 2024-01-13 DIAGNOSIS — D72829 Elevated white blood cell count, unspecified: Secondary | ICD-10-CM | POA: Diagnosis not present

## 2024-01-13 DIAGNOSIS — R1013 Epigastric pain: Secondary | ICD-10-CM

## 2024-01-13 HISTORY — DX: Gastro-esophageal reflux disease without esophagitis: K21.9

## 2024-01-13 LAB — COMPREHENSIVE METABOLIC PANEL WITH GFR
ALT: 26 U/L (ref 0–44)
AST: 19 U/L (ref 15–41)
Albumin: 4.8 g/dL (ref 3.5–5.0)
Alkaline Phosphatase: 78 U/L (ref 38–126)
Anion gap: 16 — ABNORMAL HIGH (ref 5–15)
BUN: 14 mg/dL (ref 6–20)
CO2: 23 mmol/L (ref 22–32)
Calcium: 10.4 mg/dL — ABNORMAL HIGH (ref 8.9–10.3)
Chloride: 99 mmol/L (ref 98–111)
Creatinine, Ser: 1.18 mg/dL (ref 0.61–1.24)
GFR, Estimated: >60 mL/min (ref >60)
Glucose, Bld: 103 mg/dL — ABNORMAL HIGH (ref 70–99)
Potassium: 3.7 mmol/L (ref 3.5–5.1)
Sodium: 138 mmol/L (ref 135–145)
Total Bilirubin: 0.8 mg/dL (ref 0.0–1.2)
Total Protein: 7.8 g/dL (ref 6.5–8.1)

## 2024-01-13 LAB — CBC
HCT: 45.3 % (ref 39.0–52.0)
Hemoglobin: 15.8 g/dL (ref 13.0–17.0)
MCH: 30.2 pg (ref 26.0–34.0)
MCHC: 34.9 g/dL (ref 30.0–36.0)
MCV: 86.5 fL (ref 80.0–100.0)
Platelets: 331 10*3/uL (ref 150–400)
RBC: 5.24 MIL/uL (ref 4.22–5.81)
RDW: 12.5 % (ref 11.5–15.5)
WBC: 11.1 10*3/uL — ABNORMAL HIGH (ref 4.0–10.5)
nRBC: 0 % (ref 0.0–0.2)

## 2024-01-13 LAB — URINALYSIS, ROUTINE W REFLEX MICROSCOPIC
Bacteria, UA: NONE SEEN
Bilirubin Urine: NEGATIVE
Glucose, UA: NEGATIVE mg/dL
Hgb urine dipstick: NEGATIVE
Ketones, ur: 15 mg/dL — AB
Leukocytes,Ua: NEGATIVE
Nitrite: NEGATIVE
Protein, ur: 30 mg/dL — AB
Specific Gravity, Urine: 1.036 — ABNORMAL HIGH (ref 1.005–1.030)
pH: 6 (ref 5.0–8.0)

## 2024-01-13 LAB — LIPASE, BLOOD: Lipase: 30 U/L (ref 11–51)

## 2024-01-13 MED ORDER — ONDANSETRON HCL 4 MG/2ML IJ SOLN
4.0000 mg | Freq: Once | INTRAMUSCULAR | Status: DC | PRN
Start: 2024-01-13 — End: 2024-01-14

## 2024-01-13 MED ORDER — KETOROLAC TROMETHAMINE 30 MG/ML IJ SOLN
30.0000 mg | Freq: Once | INTRAMUSCULAR | Status: AC
  Administered 2024-01-13: 30 mg via INTRAVENOUS
  Filled 2024-01-13: qty 1

## 2024-01-13 MED ORDER — ONDANSETRON HCL 4 MG/2ML IJ SOLN
4.0000 mg | Freq: Once | INTRAMUSCULAR | Status: AC
Start: 2024-01-13 — End: 2024-01-13
  Administered 2024-01-13: 4 mg via INTRAVENOUS
  Filled 2024-01-13: qty 2

## 2024-01-13 MED ORDER — SODIUM CHLORIDE 0.9 % IV BOLUS
1000.0000 mL | Freq: Once | INTRAVENOUS | Status: AC
  Administered 2024-01-13: 1000 mL via INTRAVENOUS

## 2024-01-13 MED ORDER — IOHEXOL 300 MG/ML  SOLN
100.0000 mL | Freq: Once | INTRAMUSCULAR | Status: AC | PRN
  Administered 2024-01-13: 100 mL via INTRAVENOUS

## 2024-01-13 NOTE — ED Triage Notes (Signed)
 Thursday started with stomach ache/ n/v/diarrhea on Friday. Diarrhea has stopped but abdominal pain is worse, epigastric

## 2024-01-13 NOTE — ED Provider Notes (Signed)
 [facility] DEPARTMENT AT [facility] Provider Note   CSN: [id number] Arrival date & time: 01/13/24  1809     History  Chief Complaint  Patient presents with   Abdominal Pain    Brett Wheeler is a 48 y.o. male.  Patient is a 48 year old male with history of GERD, hyperlipidemia.  Patient presenting today with complaints of abdominal pain.  He started yesterday with several episodes of diarrhea and 1 episode of vomiting.  The vomiting and diarrhea seem to have resolved, but his pain has persisted.  He describes constant pain to the epigastric region that is worse with palpation and movement.  No alleviating factors.  He denies any fevers or chills.  No ill contacts.  He denies any bloody stool or vomit.       Home Medications Prior to Admission medications   Medication Sig Start Date End Date Taking? Authorizing Provider  albuterol  (VENTOLIN  HFA) 108 (90 Base) MCG/ACT inhaler Inhale 1-2 puffs into the lungs every 6 (six) hours as needed for wheezing or shortness of breath. 08/18/23   Starlene Eaton, FNP  amoxicillin -clavulanate (AUGMENTIN ) 875-125 MG tablet Take 1 tablet by mouth 2 (two) times daily. 10/09/23   Estill Hemming, DO  fluticasone  (FLONASE ) 50 MCG/ACT nasal spray Place 2 sprays into both nostrils daily. 10/09/23   Estill Hemming, DO  losartan  (COZAAR ) 100 MG tablet Take 1 tablet (100 mg total) by mouth daily. 07/17/23   Copland, Skipper Dumas, MD  omeprazole (PRILOSEC) 20 MG capsule Take 20 mg by mouth daily.    [provider]  pravastatin  (PRAVACHOL ) 20 MG tablet Take 1 tablet (20 mg total) by mouth daily. 07/17/23   Copland, Jessica C, MD  predniSONE  (DELTASONE ) 10 MG tablet TAKE 3 TABLETS PO QD FOR 3 DAYS THEN TAKE 2 TABLETS PO QD FOR 3 DAYS THEN TAKE 1 TABLET PO QD FOR 3 DAYS THEN TAKE 1/2 TAB PO QD FOR 3 DAYS 10/09/23   Estill Hemming, DO      Allergies    Patient has no known allergies.    Review of Systems   Review  of Systems  All other systems reviewed and are negative.   Physical Exam Updated Vital Signs BP 135/88   Pulse 72   Temp 98.1 F (36.7 C)   Resp 18   Wt 97.5 kg   SpO2 95%   BMI 26.17 kg/m  Physical Exam Vitals and nursing note reviewed.  Constitutional:      General: He is not in acute distress.    Appearance: He is well-developed. He is not diaphoretic.  HENT:     Head: Normocephalic and atraumatic.  Cardiovascular:     Rate and Rhythm: Normal rate and regular rhythm.     Heart sounds: No murmur heard.    No friction rub.  Pulmonary:     Effort: Pulmonary effort is normal. No respiratory distress.     Breath sounds: Normal breath sounds. No wheezing or rales.  Abdominal:     General: Bowel sounds are normal. There is no distension.     Palpations: Abdomen is soft.     Tenderness: There is abdominal tenderness in the epigastric area. There is no right CVA tenderness, left CVA tenderness, guarding or rebound.  Musculoskeletal:        General: Normal range of motion.     Cervical back: Normal range of motion and neck supple.  Skin:    General: Skin  is warm and dry.  Neurological:     Mental Status: He is alert and oriented to person, place, and time.     Coordination: Coordination normal.     ED Results / Procedures / Treatments   Labs (all labs ordered are listed, but only abnormal results are displayed) Labs Reviewed  COMPREHENSIVE METABOLIC PANEL WITH GFR - Abnormal; Notable for the following components:      Result Value   Glucose, Bld 103 (*)    Calcium 10.4 (*)    Anion gap 16 (*)    All other components within normal limits  CBC - Abnormal; Notable for the following components:   WBC 11.1 (*)    All other components within normal limits  URINALYSIS, ROUTINE W REFLEX MICROSCOPIC - Abnormal; Notable for the following components:   Specific Gravity, Urine 1.036 (*)    Ketones, ur 15 (*)    Protein, ur 30 (*)    All other components within normal limits   LIPASE, BLOOD    EKG EKG Interpretation Date/Time:  Saturday Jan 13 2024 18:34:41 EDT Ventricular Rate:  103 PR Interval:  134 QRS Duration:  74 QT Interval:  332 QTC Calculation: 434 R Axis:   60  Text Interpretation: Sinus tachycardia Nonspecific T wave abnormality Abnormal ECG No previous ECGs available Confirmed by Racheal Buddle [phone]) on 01/13/2024 6:39:01 PM  Radiology No results found.  Procedures Procedures  {Document cardiac monitor, telemetry assessment procedure when appropriate:1}  Medications Ordered in ED Medications  ondansetron  (ZOFRAN ) injection 4 mg (has no administration in time range)  sodium chloride  0.9 % bolus 1,000 mL (has no administration in time range)  ondansetron  (ZOFRAN ) injection 4 mg (has no administration in time range)  ketorolac (TORADOL) 30 MG/ML injection 30 mg (has no administration in time range)    ED Course/ Medical Decision Making/ A&P   {   Click here for ABCD2, HEART and other calculatorsREFRESH Note before signing :1}                              Medical Decision Making Amount and/or Complexity of Data Reviewed Labs: ordered. Radiology: ordered.  Risk Prescription drug management.   ***  {Document critical care time when appropriate:1} {Document review of labs and clinical decision tools ie heart score, Chads2Vasc2 etc:1}  {Document your independent review of radiology images, and any outside records:1} {Document your discussion with family members, caretakers, and with consultants:1} {Document social determinants of health affecting pt's care:1} {Document your decision making why or why not admission, treatments were needed:1} Final Clinical Impression(s) / ED Diagnoses Final diagnoses:  None    Rx / DC Orders ED Discharge Orders     None

## 2024-01-13 NOTE — ED Notes (Signed)
 Patient transported to CT

## 2024-01-14 MED ORDER — HYDROCODONE-ACETAMINOPHEN 5-325 MG PO TABS: 1.0000 | ORAL_TABLET | Freq: Four times a day (QID) | ORAL | 0 refills | Status: AC | PRN

## 2024-01-14 NOTE — Discharge Instructions (Signed)
 Begin taking ibuprofen 600 mg every 6 hours as needed for pain.  Begin taking hydrocodone  as prescribed as needed for pain not relieved with ibuprofen.  Follow-up with primary doctor if symptoms are not improving in the next few days, and return to the ER if you develop worsening abdominal pain, high fevers, bloody stools, or for other new and concerning symptoms.

## 2024-01-14 NOTE — ED Notes (Signed)
 ED Provider at bedside.

## 2024-01-14 NOTE — ED Notes (Signed)
Reviewed discharge instructions, medications, and home care with pt. Pt verbalized understanding and had no further questions. Pt exited ED without complications.

## 2024-09-16 ENCOUNTER — Other Ambulatory Visit: Payer: Self-pay | Admitting: Family Medicine

## 2024-09-16 DIAGNOSIS — I10 Essential (primary) hypertension: Secondary | ICD-10-CM
